# Patient Record
Sex: Male | Born: 1945 | Race: White | Hispanic: No | Marital: Married | Smoking: Former smoker
Health system: Southern US, Community
[De-identification: ages and names within clinical notes are randomized; demographics above are authoritative.]

## PROBLEM LIST (undated history)

## (undated) DIAGNOSIS — T8859XA Other complications of anesthesia, initial encounter: Secondary | ICD-10-CM

## (undated) DIAGNOSIS — G4733 Obstructive sleep apnea (adult) (pediatric): Secondary | ICD-10-CM

## (undated) DIAGNOSIS — R519 Headache, unspecified: Secondary | ICD-10-CM

## (undated) DIAGNOSIS — T753XXA Motion sickness, initial encounter: Secondary | ICD-10-CM

## (undated) DIAGNOSIS — M1990 Unspecified osteoarthritis, unspecified site: Secondary | ICD-10-CM

## (undated) DIAGNOSIS — K5792 Diverticulitis of intestine, part unspecified, without perforation or abscess without bleeding: Secondary | ICD-10-CM

## (undated) DIAGNOSIS — R51 Headache: Secondary | ICD-10-CM

## (undated) DIAGNOSIS — K219 Gastro-esophageal reflux disease without esophagitis: Secondary | ICD-10-CM

## (undated) HISTORY — PX: HAND SURGERY: SHX662

## (undated) HISTORY — PX: KNEE SURGERY: SHX244

---

## 2003-05-05 ENCOUNTER — Ambulatory Visit (HOSPITAL_COMMUNITY): Admission: RE | Admit: 2003-05-05 | Discharge: 2003-05-05 | Payer: Self-pay | Admitting: Gastroenterology

## 2003-05-05 ENCOUNTER — Encounter (INDEPENDENT_AMBULATORY_CARE_PROVIDER_SITE_OTHER): Payer: Self-pay | Admitting: *Deleted

## 2006-12-11 ENCOUNTER — Encounter: Admission: RE | Admit: 2006-12-11 | Discharge: 2006-12-11 | Payer: Self-pay | Admitting: Orthopedic Surgery

## 2010-11-15 NOTE — Op Note (Signed)
NAME:  Manuel Beard, Manuel Beard                           ACCOUNT NO.:  [id number]   MEDICAL RECORD NO.:  [id number]                   PATIENT TYPE:  AMB   LOCATION:  ENDO                                 FACILITY:  [facility]   PHYSICIAN:  Anselmo Rod, M.D.               DATE OF BIRTH:  19-Mar-1946   DATE OF PROCEDURE:  05/05/2003  DATE OF DISCHARGE:                                 OPERATIVE REPORT   PROCEDURE:  Colonoscopy with snare polypectomy x1.   ENDOSCOPIST:  Anselmo Rod, M.D.   INSTRUMENT USED:  Olympus video colonoscope.   INDICATIONS FOR PROCEDURE:  A 65 year old white male undergoing screening  colonoscopy to rule out colonic polyps, masses, etc.   PREPROCEDURE PREPARATION:  Informed consent was obtained from the patient  and the patient was  fasted for 8 hours prior to  the procedure and prepped  with a bottle of magnesium citrate and a gallon of GoLYTELY the  night prior  to the procedure.   PREPROCEDURE PHYSICAL:  The patient had stable vital signs. Neck supple.  Chest clear to auscultation, S1, S2 regular. Abdomen soft with normoactive  bowel sounds.   DESCRIPTION OF PROCEDURE:  The patient was placed in the left lateral  decubitus position and sedated with 80 mg of Demerol and 8 mg of Versed in  slow incremental doses. Once sedation was adequate, the patient was  maintained on low flow oxygen and continuous cardiac monitoring.   The Olympus video colonoscope was advanced from the rectum to the cecum with  difficulty. The patient had a large amount of residual stool in the right  colon. Multiple washings were done. The patient's position  was changed from  the left lateral to the supine position to mobilize the stool pool. The  appendiceal orifice and ileocecal valve were clearly visualized and  photographed. The terminal ileum appeared  normal but small  sessile polyps  were snare from the proximal right colon. No other masses or polyps were  seen. Small lesions  could have been missed.   IMPRESSION:  1. Small nonbleeding internal hemorrhoids.  2. Small sessile polyp snared from the proximal right colon.  3. Sigmoid diverticulosis.  4. Normal appearing transverse colon.  5. A large amount of stool  in the right colon, small lesions could have     been missed.  6. Normal terminal ileum.   RECOMMENDATIONS:  1. Await pathology results.  2. Avoid all nonsteroidals including  aspirin  for the next 3 weeks.  3.     A high fiber diet with liberal fluid intake.  4. Outpatient follow up in the next 1 week or earlier if need be.   RECOMMENDATIONS:  Anselmo Rod, M.D.    JNM/MEDQ  D:  05/05/2003  T:  05/06/2003  Job:  [id number]   cc:   Knox Royalty, M.D.

## 2012-02-12 ENCOUNTER — Emergency Department (HOSPITAL_COMMUNITY): Payer: BC Managed Care – PPO

## 2012-02-12 ENCOUNTER — Encounter (HOSPITAL_COMMUNITY): Payer: Self-pay | Admitting: Emergency Medicine

## 2012-02-12 ENCOUNTER — Emergency Department (HOSPITAL_COMMUNITY)
Admission: EM | Admit: 2012-02-12 | Discharge: 2012-02-12 | Disposition: A | Discharge status: Home / Self Care | Payer: BC Managed Care – PPO | Attending: Emergency Medicine | Admitting: Emergency Medicine

## 2012-02-12 DIAGNOSIS — K5792 Diverticulitis of intestine, part unspecified, without perforation or abscess without bleeding: Secondary | ICD-10-CM

## 2012-02-12 DIAGNOSIS — N509 Disorder of male genital organs, unspecified: Secondary | ICD-10-CM | POA: Insufficient documentation

## 2012-02-12 DIAGNOSIS — R109 Unspecified abdominal pain: Secondary | ICD-10-CM | POA: Insufficient documentation

## 2012-02-12 LAB — CBC WITH DIFFERENTIAL/PLATELET
Basophils Absolute: 0.1 10*3/uL (ref 0.0–0.1)
Basophils Relative: 0 % (ref 0–1)
Eosinophils Relative: 1 % (ref 0–5)
HCT: 43.4 % (ref 39.0–52.0)
Hemoglobin: 15.4 g/dL (ref 13.0–17.0)
Lymphocytes Relative: 10 % — ABNORMAL LOW (ref 12–46)
MCHC: 35.5 g/dL (ref 30.0–36.0)
MCV: 91.2 fL (ref 78.0–100.0)
Monocytes Absolute: 3.2 10*3/uL — ABNORMAL HIGH (ref 0.1–1.0)
Monocytes Relative: 16 % — ABNORMAL HIGH (ref 3–12)
Neutro Abs: 14.6 10*3/uL — ABNORMAL HIGH (ref 1.7–7.7)
RDW: 13.8 % (ref 11.5–15.5)

## 2012-02-12 LAB — COMPREHENSIVE METABOLIC PANEL
ALT: 14 U/L (ref 0–53)
AST: 20 U/L (ref 0–37)
CO2: 23 mEq/L (ref 19–32)
Chloride: 98 mEq/L (ref 96–112)
GFR calc Af Amer: 79 mL/min — ABNORMAL LOW (ref >90)
GFR calc non Af Amer: 68 mL/min — ABNORMAL LOW (ref >90)
Glucose, Bld: 93 mg/dL (ref 70–99)
Sodium: 134 mEq/L — ABNORMAL LOW (ref 135–145)
Total Bilirubin: 1.1 mg/dL (ref 0.3–1.2)

## 2012-02-12 LAB — URINALYSIS, ROUTINE W REFLEX MICROSCOPIC
Glucose, UA: NEGATIVE mg/dL
Ketones, ur: NEGATIVE mg/dL
Leukocytes, UA: NEGATIVE
Protein, ur: NEGATIVE mg/dL
pH: 5 (ref 5.0–8.0)

## 2012-02-12 LAB — LIPASE, BLOOD: Lipase: 47 U/L (ref 11–59)

## 2012-02-12 MED ORDER — CIPROFLOXACIN HCL 500 MG PO TABS: 500.0000 mg | ORAL_TABLET | Freq: Two times a day (BID) | ORAL | Status: AC

## 2012-02-12 MED ORDER — METRONIDAZOLE 500 MG PO TABS: 500.0000 mg | ORAL_TABLET | Freq: Three times a day (TID) | ORAL | Status: AC

## 2012-02-12 MED ORDER — METRONIDAZOLE 500 MG PO TABS
500.0000 mg | ORAL_TABLET | Freq: Once | ORAL | Status: AC
  Administered 2012-02-12: 500 mg via ORAL
  Filled 2012-02-12: qty 1

## 2012-02-12 MED ORDER — SODIUM CHLORIDE 0.9 % IV SOLN: 1000.0000 mL | INTRAVENOUS | Status: DC

## 2012-02-12 MED ORDER — OXYCODONE-ACETAMINOPHEN 5-325 MG PO TABS: 2.0000 | ORAL_TABLET | ORAL | Status: AC | PRN

## 2012-02-12 MED ORDER — IOHEXOL 300 MG/ML  SOLN
100.0000 mL | Freq: Once | INTRAMUSCULAR | Status: AC | PRN
  Administered 2012-02-12: 100 mL via INTRAVENOUS

## 2012-02-12 MED ORDER — CIPROFLOXACIN HCL 500 MG PO TABS
500.0000 mg | ORAL_TABLET | Freq: Once | ORAL | Status: AC
  Administered 2012-02-12: 500 mg via ORAL
  Filled 2012-02-12: qty 1

## 2012-02-12 NOTE — ED Notes (Signed)
Pt presenting to ed with c/o left side flank pain pt denies hematuria. Pt states positive nausea no vomiting pt states positive fever and chills.

## 2012-02-12 NOTE — ED Provider Notes (Signed)
History     CSN: [id number]  Arrival date & time 02/12/12  1506   First MD Initiated Contact with Patient 02/12/12 1549      Chief Complaint  Patient presents with  . Flank Pain    (Consider location/radiation/quality/duration/timing/severity/associated sxs/prior treatment) HPI Complains of bilateral testicular pain onset 3 days ago. Testicular pain has resolved pain movement of left flank earlier today. Left flank pain is nonradiating. Worse with palpation improved with remaining still. Last bowel movement this morning, normal no nausea no vomiting no fever no other complaint no other associated symptoms no treatment prior to coming here. Patient reports feeling has pain when he presses on left flank. Asymptomatic presently History reviewed. No pertinent past medical history. Past medical history diverticulitis "bruised spleen" Past Surgical History  Procedure Date  . Knee surgery     No family history on file.  History  Substance Use Topics  . Smoking status: Former Games developer  . Smokeless tobacco: Not on file  . Alcohol Use: Yes     occassionally      Review of Systems  Constitutional: Negative.   HENT: Negative.   Respiratory: Negative.   Cardiovascular: Negative.   Gastrointestinal: Negative.   Genitourinary: Positive for flank pain and testicular pain.  Musculoskeletal: Negative.   Skin: Negative.   Neurological: Negative.   Hematological: Negative.   Psychiatric/Behavioral: Negative.   All other systems reviewed and are negative.    Allergies  Erythromycin and Penicillins  Home Medications   Current Outpatient Rx  Name Route Sig Dispense Refill  . LORATADINE 10 MG PO TABS Oral Take 10 mg by mouth daily.    Marland Kitchen OMEPRAZOLE 20 MG PO CPDR Oral Take 20 mg by mouth daily.      BP 139/90  Pulse 94  Temp 99.9 F (37.7 C)  Resp 17  SpO2 96%  Physical Exam  Nursing note and vitals reviewed. Constitutional: He appears well-developed and well-nourished.    HENT:  Head: Normocephalic and atraumatic.  Eyes: Conjunctivae are normal. Pupils are equal, round, and reactive to light.  Neck: Neck supple. No tracheal deviation present. No thyromegaly present.  Cardiovascular: Normal rate and regular rhythm.   No murmur heard. Pulmonary/Chest: Effort normal and breath sounds normal.  Abdominal: Soft. Bowel sounds are normal. He exhibits no distension and no mass. There is tenderness. There is guarding.       Tenderness at left upper quadrant and left lower quadrants voluntary guarding  Genitourinary:       Normal male genitalia  Musculoskeletal: Normal range of motion. He exhibits no edema and no tenderness.  Neurological: He is alert. Coordination normal.  Skin: Skin is warm and dry. No rash noted.  Psychiatric: He has a normal mood and affect.    ED Course  Procedures (including critical care time)  Labs Reviewed  URINALYSIS, ROUTINE W REFLEX MICROSCOPIC - Abnormal; Notable for the following:    Hgb urine dipstick SMALL (*)     All other components within normal limits  CBC WITH DIFFERENTIAL - Abnormal; Notable for the following:    WBC 20.0 (*)     Neutro Abs 14.6 (*)     Lymphocytes Relative 10 (*)     Monocytes Relative 16 (*)     Monocytes Absolute 3.2 (*)     All other components within normal limits  URINE MICROSCOPIC-ADD ON  COMPREHENSIVE METABOLIC PANEL   No results found. Results for orders placed during the hospital encounter of 02/12/12  URINALYSIS, ROUTINE  W REFLEX MICROSCOPIC      Component Value Range   Color, Urine YELLOW  YELLOW   APPearance CLEAR  CLEAR   Specific Gravity, Urine 1.026  1.005 - 1.030   pH 5.0  5.0 - 8.0   Glucose, UA NEGATIVE  NEGATIVE mg/dL   Hgb urine dipstick SMALL (*) NEGATIVE   Bilirubin Urine NEGATIVE  NEGATIVE   Ketones, ur NEGATIVE  NEGATIVE mg/dL   Protein, ur NEGATIVE  NEGATIVE mg/dL   Urobilinogen, UA 0.2  0.0 - 1.0 mg/dL   Nitrite NEGATIVE  NEGATIVE   Leukocytes, UA NEGATIVE   NEGATIVE  CBC WITH DIFFERENTIAL      Component Value Range   WBC 20.0 (*) 4.0 - 10.5 K/uL   RBC 4.76  4.22 - 5.81 MIL/uL   Hemoglobin 15.4  13.0 - 17.0 g/dL   HCT 16.1  09.6 - 04.5 %   MCV 91.2  78.0 - 100.0 fL   MCH 32.4  26.0 - 34.0 pg   MCHC 35.5  30.0 - 36.0 g/dL   RDW 40.9  81.1 - 91.4 %   Platelets 176  150 - 400 K/uL   Neutrophils Relative 73  43 - 77 %   Neutro Abs 14.6 (*) 1.7 - 7.7 K/uL   Lymphocytes Relative 10 (*) 12 - 46 %   Lymphs Abs 1.9  0.7 - 4.0 K/uL   Monocytes Relative 16 (*) 3 - 12 %   Monocytes Absolute 3.2 (*) 0.1 - 1.0 K/uL   Eosinophils Relative 1  0 - 5 %   Eosinophils Absolute 0.2  0.0 - 0.7 K/uL   Basophils Relative 0  0 - 1 %   Basophils Absolute 0.1  0.0 - 0.1 K/uL  URINE MICROSCOPIC-ADD ON      Component Value Range   Squamous Epithelial / LPF RARE  RARE   RBC / HPF 0-2  <3 RBC/hpf  LIPASE, BLOOD      Component Value Range   Lipase 47  11 - 59 U/L  COMPREHENSIVE METABOLIC PANEL      Component Value Range   Sodium 134 (*) 135 - 145 mEq/L   Potassium 3.8  3.5 - 5.1 mEq/L   Chloride 98  96 - 112 mEq/L   CO2 23  19 - 32 mEq/L   Glucose, Bld 93  70 - 99 mg/dL   BUN 16  6 - 23 mg/dL   Creatinine, Ser 7.82  0.50 - 1.35 mg/dL   Calcium 9.2  8.4 - 95.6 mg/dL   Total Protein 7.7  6.0 - 8.3 g/dL   Albumin 3.8  3.5 - 5.2 g/dL   AST 20  0 - 37 U/L   ALT 14  0 - 53 U/L   Alkaline Phosphatase 89  39 - 117 U/L   Total Bilirubin 1.1  0.3 - 1.2 mg/dL   GFR calc non Af Amer 68 (*) >90 mL/min   GFR calc Af Amer 79 (*) >90 mL/min   Ct Abdomen Pelvis W Contrast  02/12/2012  *RADIOLOGY REPORT*  Clinical Data: Left flank pain, nausea, fever.  CT ABDOMEN AND PELVIS WITH CONTRAST  Technique:  Multidetector CT imaging of the abdomen and pelvis was performed following the standard protocol during bolus administration of intravenous contrast.  Contrast: OMNIPAQUE IOHEXOL 300 MG/ML  SOLN  Comparison: None.  Findings: Visualized lung bases clear.  Small hiatal  hernia. Probable sub centimeter cyst in the lateral left hepatic segment near the dome.  Otherwise  unremarkable liver, gallbladder, spleen, pancreas, adrenal glands, kidneys.  Patchy aortoiliac atheromatous calcifications without aneurysm.  Stomach and small bowel are nondistended.  Normal appendix.  The colon is nondilated.  There are scattered distal descending and sigmoid diverticula. There is asymmetric wall thickening in the mid and distal portions of the descending colon, with moderate regional edematous/inflammatory changes.  No discrete extraluminal fluid collection to suggest abscess.  No free air.  No ascites.  Urinary bladder is physiologically distended.  Portal vein patent.  No central mesenteric, pelvic, or retroperitoneal adenopathy.  No hydronephrosis. Regional bones unremarkable.  IMPRESSION:  1.  Descending colon diverticulitis without abscess. 2.  Small hiatal hernia.  Original Report Authenticated By: Osa Craver, M.D.     No diagnosis found.  7:40 PM remains comfortable  MDM  Patient not ill-appearing. Can be treated as outpatient plan prescription Cipro, Flagyl, Percocet. Patient has scheduled appointment with Dr. Loreta Ave in 2 weeks Diagnosis diverticulitis        Doug Sou, MD 02/12/12 1950

## 2012-02-12 NOTE — ED Notes (Signed)
Pt denies c/o CP or SOB.

## 2012-09-20 DIAGNOSIS — J342 Deviated nasal septum: Secondary | ICD-10-CM | POA: Insufficient documentation

## 2012-09-20 DIAGNOSIS — J320 Chronic maxillary sinusitis: Secondary | ICD-10-CM | POA: Insufficient documentation

## 2012-09-20 DIAGNOSIS — J309 Allergic rhinitis, unspecified: Secondary | ICD-10-CM | POA: Insufficient documentation

## 2012-09-20 DIAGNOSIS — J343 Hypertrophy of nasal turbinates: Secondary | ICD-10-CM | POA: Insufficient documentation

## 2013-01-10 DIAGNOSIS — R040 Epistaxis: Secondary | ICD-10-CM | POA: Insufficient documentation

## 2014-06-12 ENCOUNTER — Ambulatory Visit: Payer: Self-pay | Admitting: Physician Assistant

## 2014-06-16 LAB — WOUND CULTURE

## 2014-09-07 ENCOUNTER — Ambulatory Visit: Payer: Self-pay | Admitting: Physician Assistant

## 2014-12-26 ENCOUNTER — Encounter: Payer: Self-pay | Admitting: *Deleted

## 2015-01-03 NOTE — Discharge Instructions (Signed)
[facility] °[facility] °ENDOSCOPIC SINUS SURGERY °[facility] ° °What is Functional Endoscopic Sinus Surgery? ° The Surgery involves making the natural openings of the sinuses larger by removing the bony partitions that separate the sinuses from the nasal cavity.  The natural sinus lining is preserved as much as possible to allow the sinuses to resume normal function after the surgery.  In some patients nasal polyps (excessively swollen lining of the sinuses) may be removed to relieve obstruction of the sinus openings.  The surgery is performed through the nose using lighted scopes, which eliminates the need for incisions on the face.  A septoplasty is a different procedure which is sometimes performed with sinus surgery.  It involves straightening the boy partition that separates the two sides of your nose.  A crooked or deviated septum may need repair if is obstructing the sinuses or nasal airflow.  Turbinate reduction is also often performed during sinus surgery.  The turbinates are bony proturberances from the side walls of the nose which swell and can obstruct the nose in patients with sinus and allergy problems.  Their size can be surgically reduced to help relieve nasal obstruction. ° °What Can Sinus Surgery Do For Me? ° Sinus surgery can reduce the frequency of sinus infections requiring antibiotic treatment.  This can provide improvement in nasal congestion, post-nasal drainage, facial pressure and nasal obstruction.  Surgery will NOT prevent you from ever having an infection again, so it usually only for patients who get infections 4 or more times yearly requiring antibiotics, or for infections that do not clear with antibiotics.  It will not cure nasal allergies, so patients with allergies may still require medication to treat their allergies after surgery. Surgery may improve headaches related to sinusitis, however, some people will continue to  require medication to control sinus headaches related to allergies.  Surgery will do nothing for other forms of headache (migraine, tension or cluster). °What Are the Risks of Endoscopic Sinus Surgery? ° Current techniques allow surgery to be performed safely with little risk, however, there are rare complications that patients should be aware of.  Because the sinuses are located around the eyes, there is risk of eye injury, including blindness, though again, this would be quite rare. This is usually a result of bleeding behind the eye during surgery, which puts the vision oat risk, though there are treatments to protect the vision and prevent permanent disrupted by surgery causing a leak of the spinal fluid that surrounds the brain.  More serious complications would include bleeding inside the brain cavity or damage to the brain.  Again, all of these complications are uncommon, and spinal fluid leaks can be safely managed surgically if they occur.  The most common complication of sinus surgery is bleeding from the nose, which may require packing or cauterization of the nose.  Continued sinus have polyps may experience recurrence of the polyps requiring revision surgery.  Alterations of sense of smell or injury to the tear ducts are also rare complications.  °What is the Surgery Like, and what is the Recovery? ° The Surgery usually takes a couple of hours to perform, and is usually performed under a general anesthetic (completely asleep).  Patients are usually discharged home after a couple of hours.  Sometimes during surgery it is necessary to pack the nose to control bleeding, and the packing is left in place for 24 - 48 hours, and removed by your surgeon.  If   a septoplasty was performed during the procedure, there is often a splint placed which must be removed after 5-7 days.   °Discomfort: Pain is usually mild to moderate, and can be controlled by prescription pain medication or acetaminophen (Tylenol).   Aspirin, Ibuprofen (Advil, Motrin), or Naprosyn (Aleve) should be avoided, as they can cause increased bleeding.  Most patients feel sinus pressure like they have a bad head cold for several days.  Sleeping with your head elevated can help reduce swelling and facial pressure, as can ice packs over the face.  A humidifier may be helpful to keep the mucous and blood from drying in the nose.  °Diet: There are no specific diet restrictions, however, you should generally start with clear liquids and a light diet of bland foods because the anesthetic can cause some nausea.  Advance your diet depending on how your stomach feels.  Taking your pain medication with food will often help reduce stomach upset which pain medications can cause. ° °Nasal Saline Irrigation: It is important to remove blood clots and dried mucous from the nose as it is healing.  This is done by having you irrigate the nose at least 3 - 4 times daily with a salt water solution.  The salt water solution is made as follows:  1) 2 - 3 heaping teaspoons of canning, pickling or sea salt °  2) 1 teaspoon baking soda, such as Arm [employer] Hammer °  3) 1 quart of warm distilled water °The nose is irrigated using an ear syringe (available at the drug store).  Fill the bulb with the solution, bend over a sink, and insert the syringe into the nose ½ to ¾ of an inch.  Point the tip of the syringe towards the inside corner of the eye on the same side your irrigating.  Squeeze the syringe and gently irrigate the nose.  If you bend forward as you do this, most of the fluid will flow back out of the nose, instead of down your throat.  Make a new solution every 2 - 3 days.  The solution should be ward, near body temperature, when you irrigate. ° °Note that if you are instructed to use Nasal Steroid Sprays at any time after your surgery, irrigate with saline BEFORE using the steroid spray, so you do not wash it all out of the nose. °Another product, Nasal Saline Gel (such as  AYR Nasal Saline Gel) can be applied in each nostril 3 - 4 times daily to moisture the nose and reduce scabbing or crusting. ° °Bleeding:  Bloody drainage from the nose can be expected for several days, and patients are instructed to irrigate their nose frequently with salt water to help remove mucous and blood clots.  The drainage may be dark red or brown, though some fresh blood may be seen intermittently, especially after irrigation.  Do not blow you nose, as bleeding may occur. If you must sneeze, keep your mouth open to allow air to escape through your mouth. °If heavy bleeding occurs: Irrigate the nose with saline to rinse out clots, then spray the nose 3 - 4 times with Afrin Nasal Decongestant Spray.  The spray will constrict the blood vessels to slow bleeding.  Pinch the lower half of your nose shut to apply pressure, and lay down with your head elevated.  Ice packs over the nose may help as well. If bleeding persists despite these measures, you should notify your doctor.  Do not use the Afrin routinely   to control nasal congestion after surgery, as it can result in worsening congestion and may affect healing.  ° °Activity: Return to work varies among patients. Most patients will be out of work at least 5 - 7 days to recover.  Patient may return to work after they are off of narcotic pain medication, and feeling well enough to perform the functions of their job.  Patients must avoid heavy lifting (over 10 pounds) or strenuous physical for 2 weeks after surgery, so your employer may need to assign you to light duty, or keep you out of work longer if light duty is not possible.  NOTE: you should not drive, operate dangerous machinery, do any mentally demanding tasks or make any important legal or financial decisions while on narcotic pain medication and recovering from the general anesthetic.  °  °Call Your Doctor Immediately if You Have Any of the Following: °1. Bleeding that you cannot control with the above  measures °2. Loss of vision, double vision, bulging of the eye or black eyes. °3. Fever over 101 degrees °4. Neck stiffness with severe headache, fever, nausea and change in mental state. °You are always encourage to call anytime with concerns, however, please call with requests for pain medication refills during office hours. ° °Office Endoscopy: During follow-up visits your doctor will remove any packing or splints that may have been placed and evaluate and clean your sinuses endoscopically.  Topical anesthetic will be used to make this as comfortable as possible, though you may want to take your pain medication prior to the visit.  How often this will need to be done varies from patient to patient.  After complete recovery from the surgery, you may need follow-up endoscopy from time to time, particularly if there is concern of recurrent infection or nasal polyps. ° ° °General Anesthesia, Care After °Refer to this sheet in the next few weeks. These instructions provide you with information on caring for yourself after your procedure. Your health care provider may also give you more specific instructions. Your treatment has been planned according to current medical practices, but problems sometimes occur. Call your health care provider if you have any problems or questions after your procedure. °WHAT TO EXPECT AFTER THE PROCEDURE °After the procedure, it is typical to experience: °· Sleepiness. °· Nausea and vomiting. °HOME CARE INSTRUCTIONS °· For the first 24 hours after general anesthesia: °¨ Have a responsible person with you. °¨ Do not drive a car. If you are alone, do not take public transportation. °¨ Do not drink alcohol. °¨ Do not take medicine that has not been prescribed by your health care provider. °¨ Do not sign important papers or make important decisions. °¨ You may resume a normal diet and activities as directed by your health care provider. °· Change bandages (dressings) as directed. °· If you  have questions or problems that seem related to general anesthesia, call the hospital and ask for the anesthetist or anesthesiologist on call. °SEEK MEDICAL CARE IF: °· You have nausea and vomiting that continue the day after anesthesia. °· You develop a rash. °SEEK IMMEDIATE MEDICAL CARE IF:  °· You have difficulty breathing. °· You have chest pain. °· You have any allergic problems. °Document Released: 09/22/2000 Document Revised: 06/21/2013 Document Reviewed: 12/30/2012 °[employer]® Patient Information ©2015 [employer]. This information is not intended to replace advice given to you by your health care provider. Make sure you discuss any questions you have with your health care provider. ° °

## 2015-01-04 ENCOUNTER — Ambulatory Visit
Admission: RE | Admit: 2015-01-04 | Discharge: 2015-01-04 | Disposition: A | Discharge status: Home / Self Care | Payer: BLUE CROSS/BLUE SHIELD | Source: Ambulatory Visit | Attending: Otolaryngology | Admitting: Otolaryngology

## 2015-01-04 ENCOUNTER — Ambulatory Visit: Payer: BLUE CROSS/BLUE SHIELD | Admitting: Anesthesiology

## 2015-01-04 ENCOUNTER — Encounter: Payer: Self-pay | Admitting: *Deleted

## 2015-01-04 ENCOUNTER — Encounter
Admission: RE | Disposition: A | Discharge status: Home / Self Care | Payer: Self-pay | Source: Ambulatory Visit | Attending: Otolaryngology

## 2015-01-04 DIAGNOSIS — D721 Eosinophilia: Secondary | ICD-10-CM | POA: Insufficient documentation

## 2015-01-04 DIAGNOSIS — Z8349 Family history of other endocrine, nutritional and metabolic diseases: Secondary | ICD-10-CM | POA: Insufficient documentation

## 2015-01-04 DIAGNOSIS — J343 Hypertrophy of nasal turbinates: Secondary | ICD-10-CM | POA: Diagnosis not present

## 2015-01-04 DIAGNOSIS — J342 Deviated nasal septum: Secondary | ICD-10-CM | POA: Insufficient documentation

## 2015-01-04 DIAGNOSIS — Z880 Allergy status to penicillin: Secondary | ICD-10-CM | POA: Diagnosis not present

## 2015-01-04 DIAGNOSIS — G4733 Obstructive sleep apnea (adult) (pediatric): Secondary | ICD-10-CM | POA: Insufficient documentation

## 2015-01-04 DIAGNOSIS — Z809 Family history of malignant neoplasm, unspecified: Secondary | ICD-10-CM | POA: Diagnosis not present

## 2015-01-04 DIAGNOSIS — K5790 Diverticulosis of intestine, part unspecified, without perforation or abscess without bleeding: Secondary | ICD-10-CM | POA: Diagnosis not present

## 2015-01-04 DIAGNOSIS — Z79899 Other long term (current) drug therapy: Secondary | ICD-10-CM | POA: Diagnosis not present

## 2015-01-04 DIAGNOSIS — J324 Chronic pansinusitis: Secondary | ICD-10-CM | POA: Diagnosis present

## 2015-01-04 HISTORY — DX: Motion sickness, initial encounter: T75.3XXA

## 2015-01-04 HISTORY — PX: IMAGE GUIDED SINUS SURGERY: SHX6570

## 2015-01-04 HISTORY — DX: Headache: R51

## 2015-01-04 HISTORY — DX: Unspecified osteoarthritis, unspecified site: M19.90

## 2015-01-04 HISTORY — PX: FRONTAL SINUS EXPLORATION: SHX6591

## 2015-01-04 HISTORY — PX: SEPTOPLASTY WITH ETHMOIDECTOMY, AND MAXILLARY ANTROSTOMY: SHX6090

## 2015-01-04 HISTORY — DX: Headache, unspecified: R51.9

## 2015-01-04 HISTORY — PX: TURBINATE REDUCTION: SHX6157

## 2015-01-04 HISTORY — DX: Gastro-esophageal reflux disease without esophagitis: K21.9

## 2015-01-04 HISTORY — PX: SPHENOIDECTOMY: SHX2421

## 2015-01-04 SURGERY — SINUS SURGERY, WITH IMAGING GUIDANCE
Anesthesia: General | Wound class: Clean Contaminated

## 2015-01-04 MED ORDER — LACTATED RINGERS IV SOLN: 500.0000 mL | INTRAVENOUS | Status: DC

## 2015-01-04 MED ORDER — ROCURONIUM BROMIDE 100 MG/10ML IV SOLN
INTRAVENOUS | Status: DC | PRN
  Administered 2015-01-04: 30 mg via INTRAVENOUS
  Administered 2015-01-04: 20 mg via INTRAVENOUS

## 2015-01-04 MED ORDER — PHENYLEPHRINE HCL 0.5 % NA SOLN
NASAL | Status: DC | PRN
  Administered 2015-01-04: 30 mL

## 2015-01-04 MED ORDER — ONDANSETRON HCL 4 MG/2ML IJ SOLN
INTRAMUSCULAR | Status: DC | PRN
  Administered 2015-01-04: 4 mg via INTRAVENOUS

## 2015-01-04 MED ORDER — PROPOFOL 10 MG/ML IV BOLUS
INTRAVENOUS | Status: DC | PRN
  Administered 2015-01-04: 200 mg via INTRAVENOUS

## 2015-01-04 MED ORDER — LACTATED RINGERS IV SOLN
INTRAVENOUS | Status: DC
  Administered 2015-01-04 (×2): via INTRAVENOUS

## 2015-01-04 MED ORDER — SUCCINYLCHOLINE CHLORIDE 20 MG/ML IJ SOLN
INTRAMUSCULAR | Status: DC | PRN
  Administered 2015-01-04: 100 mg via INTRAVENOUS

## 2015-01-04 MED ORDER — OXYCODONE HCL 5 MG/5ML PO SOLN
5.0000 mg | Freq: Once | ORAL | Status: DC | PRN
Start: 2015-01-04 — End: 2015-01-04

## 2015-01-04 MED ORDER — FENTANYL CITRATE (PF) 100 MCG/2ML IJ SOLN
INTRAMUSCULAR | Status: DC | PRN
  Administered 2015-01-04: 100 ug via INTRAVENOUS
  Administered 2015-01-04 (×2): 50 ug via INTRAVENOUS

## 2015-01-04 MED ORDER — CEFAZOLIN SODIUM-DEXTROSE 2-3 GM-% IV SOLR
2.0000 g | Freq: Once | INTRAVENOUS | Status: AC
  Administered 2015-01-04: 2 g via INTRAVENOUS

## 2015-01-04 MED ORDER — MEPERIDINE HCL 25 MG/ML IJ SOLN: 6.2500 mg | INTRAMUSCULAR | Status: DC | PRN

## 2015-01-04 MED ORDER — ONDANSETRON HCL 4 MG/2ML IJ SOLN
4.0000 mg | Freq: Once | INTRAMUSCULAR | Status: AC | PRN
  Administered 2015-01-04: 4 mg via INTRAVENOUS

## 2015-01-04 MED ORDER — OXYCODONE HCL 5 MG PO TABS: 5.0000 mg | ORAL_TABLET | Freq: Once | ORAL | Status: DC | PRN

## 2015-01-04 MED ORDER — MIDAZOLAM HCL 5 MG/5ML IJ SOLN
INTRAMUSCULAR | Status: DC | PRN
  Administered 2015-01-04: 2 mg via INTRAVENOUS

## 2015-01-04 MED ORDER — LIDOCAINE HCL (CARDIAC) 20 MG/ML IV SOLN
INTRAVENOUS | Status: DC | PRN
  Administered 2015-01-04: 40 mg via INTRAVENOUS

## 2015-01-04 MED ORDER — LIDOCAINE-EPINEPHRINE 1 %-1:100000 IJ SOLN
INTRAMUSCULAR | Status: DC | PRN
  Administered 2015-01-04: 11.5 mL

## 2015-01-04 MED ORDER — OXYMETAZOLINE HCL 0.05 % NA SOLN
2.0000 | Freq: Once | NASAL | Status: AC
  Administered 2015-01-04: 2 via NASAL

## 2015-01-04 MED ORDER — DEXAMETHASONE SODIUM PHOSPHATE 4 MG/ML IJ SOLN
INTRAMUSCULAR | Status: DC | PRN
  Administered 2015-01-04: 10 mg via INTRAVENOUS

## 2015-01-04 MED ORDER — ACETAMINOPHEN 10 MG/ML IV SOLN
INTRAVENOUS | Status: DC | PRN
  Administered 2015-01-04: 1000 mg via INTRAVENOUS

## 2015-01-04 MED ORDER — HYDROMORPHONE HCL 1 MG/ML IJ SOLN: 0.2500 mg | INTRAMUSCULAR | Status: DC | PRN

## 2015-01-04 SURGICAL SUPPLY — 49 items
BALLOON SINUPLASTY SYSTEM (BALLOONS) ×3 IMPLANT
BATTERY INSTRU NAVIGATION (MISCELLANEOUS) ×12 IMPLANT
BLADE SURG 15 STRL LF DISP TIS (BLADE) IMPLANT
BLADE SURG 15 STRL SS (BLADE)
CANISTER SUCT 1200ML W/VALVE (MISCELLANEOUS) ×3 IMPLANT
CATH IV 18X1 1/4 SAFELET (CATHETERS) ×3 IMPLANT
COAG SUCT 10F 3.5MM HAND CTRL (MISCELLANEOUS) ×3 IMPLANT
COAGULATOR SUCT 8FR VV (MISCELLANEOUS) IMPLANT
DEVICE INFLATION SEID (MISCELLANEOUS) IMPLANT
DRAPE HEAD BAR (DRAPES) ×3 IMPLANT
DRESSING NASL FOAM PST OP SINU (MISCELLANEOUS) IMPLANT
DRSG NASAL 4CM NASOPORE (MISCELLANEOUS) IMPLANT
DRSG NASAL FOAM POST OP SINU (MISCELLANEOUS)
GLOVE BIO SURGEON STRL SZ7 (GLOVE) ×6 IMPLANT
GLOVE PI ULTRA LF STRL 7.5 (GLOVE) ×4 IMPLANT
GLOVE PI ULTRA NON LATEX 7.5 (GLOVE) ×2
GOWN STRL REUS W/ TWL LRG LVL3 (GOWN DISPOSABLE) ×4 IMPLANT
GOWN STRL REUS W/TWL LRG LVL3 (GOWN DISPOSABLE) ×2
IMPL PROPEL MINI SINUS (Prosthesis and Implant ENT) ×4 IMPLANT
IMPLANT PROPEL MINI SINUS (Prosthesis and Implant ENT) ×6 IMPLANT
IRRIGATOR 4MM STR (IRRIGATION / IRRIGATOR) ×3 IMPLANT
IV CATH 18X1 1/4 SAFELET (CATHETERS) ×2
IV NS 500ML (IV SOLUTION) ×1
IV NS 500ML BAXH (IV SOLUTION) ×2 IMPLANT
NAVIGATION MASK REG  ST (MISCELLANEOUS) ×3 IMPLANT
NEEDLE HYPO 25GX1X1/2 BEV (NEEDLE) ×3 IMPLANT
NEEDLE SPNL 25GX3.5 QUINCKE BL (NEEDLE) ×3 IMPLANT
NS IRRIG 500ML POUR BTL (IV SOLUTION) ×3 IMPLANT
PACK DRAPE NASAL/ENT (PACKS) ×3 IMPLANT
PACKING NASAL EPIS 4X2.4 XEROG (MISCELLANEOUS) ×3 IMPLANT
PAD GROUND ADULT SPLIT (MISCELLANEOUS) ×3 IMPLANT
PATTIES SURGICAL .5 X3 (DISPOSABLE) ×3 IMPLANT
SET HANDPIECE IRR DIEGO (MISCELLANEOUS) ×3 IMPLANT
SINUPLASTY BALLN CATHTIP (CATHETERS) ×3 IMPLANT
SOL ANTI-FOG 6CC FOG-OUT (MISCELLANEOUS) ×2 IMPLANT
SOL FOG-OUT ANTI-FOG 6CC (MISCELLANEOUS) ×1
SPLINT NASAL SEPTAL BLV .50 ST (MISCELLANEOUS) ×3 IMPLANT
STRAP BODY AND KNEE 60X3 (MISCELLANEOUS) ×3 IMPLANT
SUT CHROMIC 3-0 (SUTURE) ×1
SUT CHROMIC 3-0 KS 27XMFL CR (SUTURE) ×2
SUT ETHILON 3-0 KS 30 BLK (SUTURE) ×3 IMPLANT
SUT ETHILON 4-0 (SUTURE)
SUT ETHILON 4-0 FS2 18XMFL BLK (SUTURE)
SUT PLAIN GUT 4-0 (SUTURE) ×3 IMPLANT
SUTURE CHRMC 3-0 KS 27XMFL CR (SUTURE) ×2 IMPLANT
SUTURE ETHLN 4-0 FS2 18XMF BLK (SUTURE) IMPLANT
SYR 3ML LL SCALE MARK (SYRINGE) ×3 IMPLANT
TOWEL OR 17X26 4PK STRL BLUE (TOWEL DISPOSABLE) ×3 IMPLANT
WATER STERILE IRR 500ML POUR (IV SOLUTION) ×3 IMPLANT

## 2015-01-04 NOTE — Op Note (Signed)
01/04/2015  2:56 PM    Manuel Beard  [id number]   Pre-Op Dx:  Septal deviation, turbinate hypertrophy, chronic bilateral pansinusitis  Post-op Dx: Septal deviation, turbinate hypertrophy, chronic bilateral pansinusitis  Proc: Septoplasty, bilateral endoscopic total ethmoidectomy, bilateral endoscopic maxillary antrostomy, bilateral endoscopic frontal sinusotomy, bilateral endoscopic sphenoid sinusotomy, bilateral partial reduction of the inferior turbinates, IGSS  Surg:  Manuel Beard  Anes:  GOT  EBL:  350 mL  Comp:  None  Findings:  Polypoid changes at the openings to the sphenoid and anterior ethmoid sinuses bilaterally. Chronic inflammation involving all the sinuses. Markedly deviated septum.  Procedure: The patient was given general anesthesia by oral endotracheal intubation. He was placed in a supine position. His nose was prepped using 6 mL of 1% Xylocaine with epi 100,000 for infiltration nasal septum. Cottonoid pledgets soaked in phenylephrine and Xylocaine were then placed both sides the nose and left there while the patient was prepped and draped sterile fashion.  Image guided system was brought in and the CT scan was downloaded to the system area and the template was applied to the face and the template was registered then to the system. There is 0.9 mm of variance. The dissection instruments were registered the system and checked and they were off slightly. The system was reregistered. There was much better alignment this time.  Complex removed and the 0 scope was used to visualize the nose anterior septum was markedly deviated and pushing way out into the right nasal opening. The caudal tip was buckled. There was a spur posteriorly on the right side of the vomer touching the lateral nasal wall. A left hemitransfixion incision was created with elevation of mucoperichondrium on both sides the quadrangular plate. It was buckled on itself anteriorly and was difficult to get  this cleaned off. I trimmed the most caudal and that was buckled on itself and then used a morselizer to break the spring of the cartilage. The bony cartilaginous junction was then split and the mucoperiosteum was elevated on both sides of the vomer and ethmoid plate. The very large vomer spur was removed and some of the crooked ethmoid plate was removed as well. His allow the mucosal flaps downfall back in their anatomic position and the septum could sit straight in the midline. 3-0 chromic sutures were used through and through to anchor it at the anterior nasal spine and to hold the anterior flaps together and close the hemitransfixion incision. Oral plain gut suture was used to through and through whip stitch fashion for closure of the rest of the septum.  0 scope was used in the left side visualize the nasal passageways the middle turbinate had been lateralized from the septum and was now medialized provide better access to the middle meatus. There is polypoid changes here. The uncinate process was incised and was removed using the [facility] microdebrider and through biting forceps. The 30 scope was used to visualize the maxillary antrum and this was widened. He Coso was inflamed in the maxillary sinus with some clear thick mucus that was suctioned out.  there was inflamed tissue superiorly there was removed as well with 90 up-biting forceps. This completed cleaning of the maxillary sinus.  The posterior middle ethmoid air cells were opened using the microdebrider. The image guided system was used for evaluating the depth of dissection and making sure all the sinuses were opened. The middle turbinate was medialized to the opening to the sphenoid sinus. There was polypoid changes here. The this sinus  opening was widened using the sphenoid punch and make sure it oh opened into the posterior ethmoid air cell to provide good drainage. The middle turbinate was medialized again and the 30 scope was then used to  open the anterior ethmoid air cells. Once this was opened the frontal sinus duct was noted to be medial. An eclamptic balloon Sinuplasty was used to thread into the frontal sinus and dilate the opening to 12 cm pressure. The opening was widened using frontal sinus through biting instruments make sure there was a good clear drainage port from the frontal sinus. Cottonoid pledges were placed here temporarily while the opposite side was addressed.   The 0 scope was used to visualize the right to the nasal passage and the middle turbinate was infractured.  the uncinate process was incised. The uncinate process was removed with the [facility] microdebrider and through biting forceps the maxillary antrum was widened with the debrider as well and 30 scope was used to make sure this was completely opened and clear. Again thickened mucous membranes and thick mucus were suctioned out. The 0 scope was used with the [place] for opening the middle and posterior ethmoid air cells. The image guided system was used to make sure all the sinuses were clear. The sphenoid sinus was opened again using sphenoid punch. There were polypoid changes around the opening that were removed. The party wall between sphenoid sinus posterior ethmoid was removed to provide better drainage.  The 30 scope was used to visualize the anterior ethmoid air cells and these were cleared out using the image guided system to make sure they were all cleared. I had a difficult time finding the opening to the right frontal sinus. I used the 30 and 70 scopes and the frontal sinus image guided suction to finally find the opening and track it into the frontal sinus. A frontal sinus instruments were used to widen this with the through biting forceps. Once this was completely open cottonoid pledgets were placed or temporarily in the left side was visualized again.  The left side was open and clear and I can see into the frontal sinus small piece of  Xerogel was placed right at the frontal sinus opening followed by a mini Propel stent. Another piece of Xerogel was then placed inside the stent. This was repeated on the right side. There was a good open airway on both sides. Xomed 0.5 mm regular sized splints were then placed on both sides of the septum and sutured through and through with a 3-0 nylon.  The patient was awakened taken to the recovery room in satisfactory condition. There were no operative complications.  Dispo:   To PACU and then to home  Plan:  To rest at home with head elevated. Follow-up in 6 days. Start saline flushes tomorrow  Huey Romans  01/04/2015 2:56 PM

## 2015-01-04 NOTE — Anesthesia Preprocedure Evaluation (Signed)
Anesthesia Evaluation  Patient identified by MRN, date of birth, ID band Patient awake    Reviewed: Allergy & Precautions, H&P , NPO status , Patient's Chart, lab work & pertinent test results, reviewed documented beta blocker date and time   Airway Mallampati: II  TM Distance: >3 FB Neck ROM: full    Dental no notable dental hx.    Pulmonary former smoker,  breath sounds clear to auscultation  Pulmonary exam normal       Cardiovascular Exercise Tolerance: Good negative cardio ROS  Rhythm:regular Rate:Normal     Neuro/Psych  Headaches, negative psych ROS   GI/Hepatic Neg liver ROS, GERD-  Medicated,  Endo/Other  negative endocrine ROS  Renal/GU negative Renal ROS  negative genitourinary   Musculoskeletal   Abdominal   Peds  Hematology negative hematology ROS (+)   Anesthesia Other Findings   Reproductive/Obstetrics negative OB ROS                             Anesthesia Physical Anesthesia Plan  ASA: II  Anesthesia Plan: General   Post-op Pain Management:    Induction:   Airway Management Planned:   Additional Equipment:   Intra-op Plan:   Post-operative Plan:   Informed Consent: I have reviewed the patients History and Physical, chart, labs and discussed the procedure including the risks, benefits and alternatives for the proposed anesthesia with the patient or authorized representative who has indicated his/her understanding and acceptance.     Plan Discussed with: CRNA  Anesthesia Plan Comments:         Anesthesia Quick Evaluation

## 2015-01-04 NOTE — Transfer of Care (Signed)
Immediate Anesthesia Transfer of Care Note  Patient: Arnulfo Batson  Procedure(s) Performed: Procedure(s) with comments: IMAGE GUIDED SINUS SURGERY (N/A) - GAVE DISK TO CE CE SEPTOPLASTY WITH ETHMOIDECTOMY, AND MAXILLARY ANTROSTOMY (Bilateral) FRONTAL SINUS EXPLORATION (Bilateral) SPHENOIDECTOMY (Bilateral) INFERIOR TURBINATE REDUCTION  (Bilateral)  Patient Location: PACU  Anesthesia Type: General  Level of Consciousness: awake, alert  and patient cooperative  Airway and Oxygen Therapy: Patient Spontanous Breathing and Patient connected to supplemental oxygen  Post-op Assessment: Post-op Vital signs reviewed, Patient's Cardiovascular Status Stable, Respiratory Function Stable, Patent Airway and No signs of Nausea or vomiting  Post-op Vital Signs: Reviewed and stable  Complications: No apparent anesthesia complications

## 2015-01-04 NOTE — Anesthesia Procedure Notes (Signed)
Procedure Name: Intubation Date/Time: 01/04/2015 11:18 AM Performed by: Londell Moh Pre-anesthesia Checklist: Patient identified, Emergency Drugs available, Suction available, Patient being monitored and Timeout performed Patient Re-evaluated:Patient Re-evaluated prior to inductionOxygen Delivery Method: Circle system utilized Preoxygenation: Pre-oxygenation with 100% oxygen Intubation Type: IV induction Ventilation: Mask ventilation without difficulty Laryngoscope Size: Mac and 3 Grade View: Grade I Tube type: Oral Rae Tube size: 7.5 mm Number of attempts: 1 Placement Confirmation: ETT inserted through vocal cords under direct vision,  positive ETCO2 and breath sounds checked- equal and bilateral Tube secured with: Tape Dental Injury: Teeth and Oropharynx as per pre-operative assessment

## 2015-01-04 NOTE — H&P (Signed)
  H&P has been reviewed and no changes necessary. To be downloaded later. 

## 2015-01-04 NOTE — Anesthesia Postprocedure Evaluation (Signed)
  Anesthesia Post-op Note  Patient: Krikor Willet  Procedure(s) Performed: Procedure(s) with comments: IMAGE GUIDED SINUS SURGERY (N/A) - GAVE DISK TO CE CE SEPTOPLASTY WITH ETHMOIDECTOMY, AND MAXILLARY ANTROSTOMY (Bilateral) FRONTAL SINUS EXPLORATION (Bilateral) SPHENOIDECTOMY (Bilateral) INFERIOR TURBINATE REDUCTION  (Bilateral)  Anesthesia type:General  Patient location: PACU  Post pain: Pain level controlled  Post assessment: Post-op Vital signs reviewed, Patient's Cardiovascular Status Stable, Respiratory Function Stable, Patent Airway and No signs of Nausea or vomiting  Post vital signs: Reviewed and stable  Last Vitals:  Filed Vitals:   01/04/15 1038  BP: 135/88  Pulse: 71  Temp: 36.5 C  Resp: 18    Level of consciousness: awake, alert  and patient cooperative  Complications: No apparent anesthesia complications

## 2015-01-05 ENCOUNTER — Encounter: Payer: Self-pay | Admitting: Otolaryngology

## 2015-01-08 LAB — SURGICAL PATHOLOGY

## 2015-01-25 ENCOUNTER — Ambulatory Visit
Admission: EM | Admit: 2015-01-25 | Discharge: 2015-01-25 | Disposition: A | Discharge status: Home / Self Care | Payer: BLUE CROSS/BLUE SHIELD

## 2015-01-25 NOTE — ED Notes (Signed)
Pt left for his doctor appointment and said he would return in 20 minutes it has now been over a hour. Pt has not return and pt will be discharged from [facility].

## 2015-03-22 ENCOUNTER — Ambulatory Visit: Payer: BLUE CROSS/BLUE SHIELD | Attending: Otolaryngology

## 2015-03-22 DIAGNOSIS — G4733 Obstructive sleep apnea (adult) (pediatric): Secondary | ICD-10-CM | POA: Insufficient documentation

## 2015-03-22 DIAGNOSIS — G2581 Restless legs syndrome: Secondary | ICD-10-CM | POA: Insufficient documentation

## 2015-03-22 DIAGNOSIS — R0683 Snoring: Secondary | ICD-10-CM | POA: Diagnosis not present

## 2015-09-10 DIAGNOSIS — M79645 Pain in left finger(s): Secondary | ICD-10-CM | POA: Diagnosis not present

## 2015-09-10 DIAGNOSIS — M79644 Pain in right finger(s): Secondary | ICD-10-CM | POA: Diagnosis not present

## 2015-10-02 DIAGNOSIS — Z880 Allergy status to penicillin: Secondary | ICD-10-CM | POA: Diagnosis not present

## 2015-10-02 DIAGNOSIS — E669 Obesity, unspecified: Secondary | ICD-10-CM | POA: Diagnosis not present

## 2015-10-02 DIAGNOSIS — G8918 Other acute postprocedural pain: Secondary | ICD-10-CM | POA: Diagnosis not present

## 2015-10-02 DIAGNOSIS — M19042 Primary osteoarthritis, left hand: Secondary | ICD-10-CM | POA: Diagnosis not present

## 2015-10-02 DIAGNOSIS — Z882 Allergy status to sulfonamides status: Secondary | ICD-10-CM | POA: Diagnosis not present

## 2015-10-02 DIAGNOSIS — Z87891 Personal history of nicotine dependence: Secondary | ICD-10-CM | POA: Diagnosis not present

## 2015-10-02 DIAGNOSIS — M1812 Unilateral primary osteoarthritis of first carpometacarpal joint, left hand: Secondary | ICD-10-CM | POA: Diagnosis not present

## 2015-10-02 DIAGNOSIS — G4730 Sleep apnea, unspecified: Secondary | ICD-10-CM | POA: Diagnosis not present

## 2015-10-02 HISTORY — PX: WRIST ARTHROSCOPY WITH CARPOMETACARPEL (CMC) ARTHROPLASTY: SHX5680

## 2015-10-19 ENCOUNTER — Encounter: Payer: Self-pay | Admitting: Gynecology

## 2015-10-19 ENCOUNTER — Ambulatory Visit
Admission: EM | Admit: 2015-10-19 | Discharge: 2015-10-19 | Disposition: A | Discharge status: Home / Self Care | Payer: Medicare Other | Attending: Family Medicine | Admitting: Family Medicine

## 2015-10-19 DIAGNOSIS — L270 Generalized skin eruption due to drugs and medicaments taken internally: Secondary | ICD-10-CM

## 2015-10-19 MED ORDER — TRIAMCINOLONE ACETONIDE 0.1 % EX CREA: 1.0000 "application " | TOPICAL_CREAM | Freq: Two times a day (BID) | CUTANEOUS | Status: DC

## 2015-10-19 MED ORDER — METHYLPREDNISOLONE 4 MG PO TBPK: ORAL_TABLET | ORAL | Status: DC

## 2015-10-19 NOTE — ED Provider Notes (Signed)
CSN: [id number]     Arrival date & time 10/19/15  1449 History   First MD Initiated Contact with Patient 10/19/15 1526     Chief Complaint  Patient presents with  . Pruritis   (Consider location/radiation/quality/duration/timing/severity/associated sxs/prior Treatment) HPI  This a 70 year old gentleman who presents with  itching all over for 4 days. The patient had hand surgery on his left hand on 10/02/2015. He stated that his cast was removed about 4 days ago. He took his last dose of oxycodone on Monday also 4 days ago and Tuesday began to have the itching sensation. Itching is apparently random occurring at different parts of his body at different times. His feet were itching terribly while  in bed. Today he has occasional itching in various places .Has noticed a small rash in patches; no large patches or urticarial type lesions. He mowed grass prior to this visit today but  itching he had predated this. He denies any change in personal products or detergents. Only significant change is the medication of  oxycodone.   Past Medical History  Diagnosis Date  . GERD (gastroesophageal reflux disease)     rare  . Arthritis     thumbs  . Headache     sinus  . Motion sickness     amusement park rides   Past Surgical History  Procedure Laterality Date  . Knee surgery Right     x1  . Knee surgery Left     x2  . Image guided sinus surgery N/A 01/04/2015    Procedure: IMAGE GUIDED SINUS SURGERY;  Surgeon: Margaretha Sheffield, MD;  Location: [place];  Service: ENT;  Laterality: N/A;  GAVE DISK TO CE CE  . Septoplasty with ethmoidectomy, and maxillary antrostomy Bilateral 01/04/2015    Procedure: SEPTOPLASTY WITH ETHMOIDECTOMY, AND MAXILLARY ANTROSTOMY;  Surgeon: Margaretha Sheffield, MD;  Location: [place];  Service: ENT;  Laterality: Bilateral;  . Frontal sinus exploration Bilateral 01/04/2015    Procedure: FRONTAL SINUS EXPLORATION;  Surgeon: Margaretha Sheffield, MD;  Location: [place];  Service: ENT;  Laterality: Bilateral;  . Sphenoidectomy Bilateral 01/04/2015    Procedure: Coralee Pesa;  Surgeon: Margaretha Sheffield, MD;  Location: [place];  Service: ENT;  Laterality: Bilateral;  . Turbinate reduction Bilateral 01/04/2015    Procedure: INFERIOR TURBINATE REDUCTION ;  Surgeon: Margaretha Sheffield, MD;  Location: [place];  Service: ENT;  Laterality: Bilateral;  . Wrist arthroscopy with carpometacarpel (cmc) arthroplasty  10/02/2015    reposition interposition arthroplasty.   No family history on file. Social History  Substance Use Topics  . Smoking status: Former Research scientist (life sciences)  . Smokeless tobacco: None  . Alcohol Use: Yes     Comment: occassionally    Review of Systems  Constitutional: Positive for activity change. Negative for fever, chills and fatigue.  Skin: Positive for rash.  All other systems reviewed and are negative.   Allergies  Erythromycin; Iron supplement; and Penicillins  Home Medications   Prior to Admission medications   Medication Sig Start Date End Date Taking? Authorizing Provider  omeprazole (PRILOSEC) 20 MG capsule Take 20 mg by mouth daily as needed.    Yes Historical Provider, MD  oxyCODONE-acetaminophen (PERCOCET/ROXICET) 5-325 MG tablet Take by mouth every 4 (four) hours as needed for severe pain.   Yes Historical Provider, MD  triamcinolone (NASACORT AQ) 55 MCG/ACT AERO nasal inhaler Place 2 sprays into the nose daily. AM   Yes Historical Provider, MD  Ca Carbonate-Mag Hydroxide (ROLAIDS  PO) Take by mouth as needed.    Historical Provider, MD  cephALEXin (KEFLEX) 500 MG capsule Take 500 mg by mouth 3 (three) times daily. 01/04/15   Margaretha Sheffield, MD  methylPREDNISolone (MEDROL DOSEPAK) 4 MG TBPK tablet Take per package instructions 10/19/15   Lorin Picket, PA-C  triamcinolone cream (KENALOG) 0.1 % Apply 1 application topically 2 (two) times daily. 10/19/15   Lorin Picket, PA-C   Meds Ordered and Administered this Visit   Medications - No data to display  BP 125/85 mmHg  Pulse 75  Temp(Src) 98 F (36.7 C) (Oral)  Resp 16  Ht 5\' 11"  (1.803 m)  Wt 231 lb (104.781 kg)  BMI 32.23 kg/m2  SpO2 99% No data found.   Physical Exam  Constitutional: He is oriented to person, place, and time. He appears well-developed and well-nourished. No distress.  HENT:  Head: Normocephalic and atraumatic.  Eyes: Conjunctivae are normal. Pupils are equal, round, and reactive to light.  Neck: Normal range of motion. Neck supple.  Musculoskeletal: Normal range of motion.  Neurological: He is alert and oriented to person, place, and time.  Skin: Skin is warm and dry. Rash noted. He is not diaphoretic.  Examination of his skin shows several small patches of macular papular rash with an erythematous base. Excoriations are present. The rash is isolated with several areas involving the legs and arms mostly sparing the face and back. He has one small patch on his abdomen. There are no web space lesions.  Psychiatric: He has a normal mood and affect. His behavior is normal. Judgment and thought content normal.  Nursing note and vitals reviewed.   ED Course  Procedures (including critical care time)  Labs Review Labs Reviewed - No data to display  Imaging Review No results found.   Visual Acuity Review  Right Eye Distance:   Left Eye Distance:   Bilateral Distance:    Right Eye Near:   Left Eye Near:    Bilateral Near:         MDM   1. 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 Test/x-ray results and diagnosis reviewed with patient 2. rx as per orders; risks, benefits, potential side  effects reviewed with patient 3. Recommend supportive treatment with not Taking any further oxycodone. I recommended using Zyrtec or Allegra during the daytime and on Benadryl at nighttime. Given a Medrol Dosepak and some topical steroid to help with the itching but if it is being caused by the oxycodone discontinuation should be self-limiting. He continues to have problems he should follow-up with the dermatologist.. 4. F/u prn if symptoms worsen or don't improve      Lorin Picket, PA-C 10/19/15 1618

## 2015-10-19 NOTE — Discharge Instructions (Signed)
Drug Rash °A drug rash is a change in the color or texture of the skin that is caused by a drug. It can develop minutes, hours, or days after the person takes the drug. °CAUSES °This condition is usually caused by a drug allergy. It can also be caused by exposure to sunlight after taking a drug that makes the skin sensitive to light. Drugs that commonly cause rashes include: °· Penicillin. °· Antibiotic medicines. °· Medicines that treat seizures. °· Medicines that treat cancer (chemotherapy). °· Aspirin and other nonsteroidal anti-inflammatory drugs (NSAIDs). °· Injectable dyes that contain iodine. °· Insulin. °SYMPTOMS °Symptoms of this condition include: °· Redness. °· Tiny bumps. °· Peeling. °· Itching. °· Itchy welts (hives). °· Swelling. °The rash may appear on a small area of skin or all over the body. °DIAGNOSIS °To diagnose the condition, your health care provider will do a physical exam. He or she may also order tests to find out which drug caused the rash. Tests to find the cause of a rash include: °· Skin tests. °· Blood tests. °· Drug challenge. For this test, you stop taking all of the drugs that you do not need to take, and then you start taking them again by adding back one of the drugs at a time. °TREATMENT °A drug rash may be treated with medicines, including: °· Antihistamines. These may be given to relieve itching. °· An NSAID. This may be given to reduce swelling and treat pain. °· A steroid drug. This may be given to reduce swelling. °The rash usually goes away when the person stops taking the drug that caused it. °HOME CARE INSTRUCTIONS °· Take medicines only as directed by your health care provider. °· Let all of your health care providers know about any drug reactions you have had in the past. °· If you have hives, take a cool shower or use a cool compress to relieve itchiness. °SEEK MEDICAL CARE IF: °· You have a fever. °· Your rash is not going away. °· Your rash gets worse. °· Your rash  comes back. °· You have wheezing or coughing. °SEEK IMMEDIATE MEDICAL CARE IF: °· You start to have breathing problems. °· You start to have shortness of breath. °· You face or throat starts to swell. °· You have severe weakness with dizziness or fainting. °· You have chest pain. °  °This information is not intended to replace advice given to you by your health care provider. Make sure you discuss any questions you have with your health care provider. °  °Document Released: 07/24/2004 Document Revised: 07/07/2014 Document Reviewed: 04/12/2014 °Elsevier Interactive Patient Education ©2016 [employer] ° °

## 2015-10-19 NOTE — ED Notes (Signed)
Patient c/o itching all over his body x 4 days. Per patient s/p left hand surgery on 07/04/2015. Patient stated after cast has remove x 4 days he has been having itching all over his body. Per pt. Not sure if he is having an allergy reaction. Patient stated he took benadryl yesterday and today but did not help.

## 2016-01-28 DIAGNOSIS — M79645 Pain in left finger(s): Secondary | ICD-10-CM | POA: Diagnosis not present

## 2016-01-28 DIAGNOSIS — Z4789 Encounter for other orthopedic aftercare: Secondary | ICD-10-CM | POA: Diagnosis not present

## 2016-05-09 DIAGNOSIS — G4733 Obstructive sleep apnea (adult) (pediatric): Secondary | ICD-10-CM | POA: Diagnosis not present

## 2016-06-13 DIAGNOSIS — M65342 Trigger finger, left ring finger: Secondary | ICD-10-CM | POA: Diagnosis not present

## 2016-06-13 DIAGNOSIS — M79645 Pain in left finger(s): Secondary | ICD-10-CM | POA: Diagnosis not present

## 2016-06-24 DIAGNOSIS — E7800 Pure hypercholesterolemia, unspecified: Secondary | ICD-10-CM | POA: Diagnosis not present

## 2016-06-24 DIAGNOSIS — Z882 Allergy status to sulfonamides status: Secondary | ICD-10-CM | POA: Diagnosis not present

## 2016-06-24 DIAGNOSIS — Z87891 Personal history of nicotine dependence: Secondary | ICD-10-CM | POA: Diagnosis not present

## 2016-06-24 DIAGNOSIS — K219 Gastro-esophageal reflux disease without esophagitis: Secondary | ICD-10-CM | POA: Diagnosis not present

## 2016-06-24 DIAGNOSIS — M65332 Trigger finger, left middle finger: Secondary | ICD-10-CM | POA: Insufficient documentation

## 2016-06-24 DIAGNOSIS — M65342 Trigger finger, left ring finger: Secondary | ICD-10-CM | POA: Diagnosis not present

## 2016-06-24 DIAGNOSIS — E669 Obesity, unspecified: Secondary | ICD-10-CM | POA: Diagnosis not present

## 2016-06-24 DIAGNOSIS — M189 Osteoarthritis of first carpometacarpal joint, unspecified: Secondary | ICD-10-CM | POA: Diagnosis not present

## 2016-06-24 DIAGNOSIS — G4730 Sleep apnea, unspecified: Secondary | ICD-10-CM | POA: Diagnosis not present

## 2016-06-24 DIAGNOSIS — Z880 Allergy status to penicillin: Secondary | ICD-10-CM | POA: Diagnosis not present

## 2016-08-08 DIAGNOSIS — M79645 Pain in left finger(s): Secondary | ICD-10-CM | POA: Diagnosis not present

## 2016-09-09 ENCOUNTER — Ambulatory Visit
Admission: EM | Admit: 2016-09-09 | Discharge: 2016-09-09 | Disposition: A | Discharge status: Home / Self Care | Payer: Medicare Other | Attending: Family Medicine | Admitting: Family Medicine

## 2016-09-09 ENCOUNTER — Encounter: Payer: Self-pay | Admitting: *Deleted

## 2016-09-09 DIAGNOSIS — H00014 Hordeolum externum left upper eyelid: Secondary | ICD-10-CM | POA: Diagnosis not present

## 2016-09-09 DIAGNOSIS — L03213 Periorbital cellulitis: Secondary | ICD-10-CM | POA: Diagnosis not present

## 2016-09-09 MED ORDER — CEPHALEXIN 500 MG PO CAPS: 500.0000 mg | ORAL_CAPSULE | Freq: Four times a day (QID) | ORAL | 0 refills | Status: DC

## 2016-09-09 NOTE — ED Provider Notes (Signed)
[facility]    CSN: [id number] Arrival date & time: 09/09/16  1152     History   Chief Complaint Chief Complaint  Patient presents with  . Otalgia  . Eye Problem    HPI Manuel Beard is a 71 y.o. male.   71 yo male with a 3 days h/o left upper eyelid bump, redness, swelling and tenderness. Denies any fevers, chills, drainage, eye injury/trauma, drainage, vision changes.   The history is provided by the patient.  Otalgia  Eye Problem    Past Medical History:  Diagnosis Date  . Arthritis    thumbs  . GERD (gastroesophageal reflux disease)    rare  . Headache    sinus  . Motion sickness    amusement park rides    There are no active problems to display for this patient.   Past Surgical History:  Procedure Laterality Date  . FRONTAL SINUS EXPLORATION Bilateral 01/04/2015   Procedure: FRONTAL SINUS EXPLORATION;  Surgeon: Margaretha Sheffield, MD;  Location: [place];  Service: ENT;  Laterality: Bilateral;  . IMAGE GUIDED SINUS SURGERY N/A 01/04/2015   Procedure: IMAGE GUIDED SINUS SURGERY;  Surgeon: Margaretha Sheffield, MD;  Location: [place];  Service: ENT;  Laterality: N/A;  GAVE DISK TO CE CE  . KNEE SURGERY Right    x1  . KNEE SURGERY Left    x2  . SEPTOPLASTY WITH ETHMOIDECTOMY, AND MAXILLARY ANTROSTOMY Bilateral 01/04/2015   Procedure: SEPTOPLASTY WITH ETHMOIDECTOMY, AND MAXILLARY ANTROSTOMY;  Surgeon: Margaretha Sheffield, MD;  Location: [place];  Service: ENT;  Laterality: Bilateral;  . SPHENOIDECTOMY Bilateral 01/04/2015   Procedure: SPHENOIDECTOMY;  Surgeon: Margaretha Sheffield, MD;  Location: [place];  Service: ENT;  Laterality: Bilateral;  . TURBINATE REDUCTION Bilateral 01/04/2015   Procedure: INFERIOR TURBINATE REDUCTION ;  Surgeon: Margaretha Sheffield, MD;  Location: [place];  Service: ENT;  Laterality: Bilateral;  . WRIST ARTHROSCOPY WITH CARPOMETACARPEL [facility]) ARTHROPLASTY  10/02/2015   reposition interposition arthroplasty.        Home Medications    Prior to Admission medications   Medication Sig Start Date End Date Taking? Authorizing Provider  Ca Carbonate-Mag Hydroxide (ROLAIDS PO) Take by mouth as needed.   Yes Historical Provider, MD  omeprazole (PRILOSEC) 20 MG capsule Take 20 mg by mouth daily as needed.    Yes Historical Provider, MD  cephALEXin (KEFLEX) 500 MG capsule Take 1 capsule (500 mg total) by mouth 4 (four) times daily. 09/09/16   Norval Gable, MD  methylPREDNISolone (MEDROL DOSEPAK) 4 MG TBPK tablet Take per package instructions 10/19/15   Lorin Picket, PA-C  oxyCODONE-acetaminophen (PERCOCET/ROXICET) 5-325 MG tablet Take by mouth every 4 (four) hours as needed for severe pain.    Historical Provider, MD  triamcinolone (NASACORT AQ) 55 MCG/ACT AERO nasal inhaler Place 2 sprays into the nose daily. AM    Historical Provider, MD  triamcinolone cream (KENALOG) 0.1 % Apply 1 application topically 2 (two) times daily. 10/19/15   Lorin Picket, PA-C    Family History History reviewed. No pertinent family history.  Social History Social History  Substance Use Topics  . Smoking status: Former Research scientist (life sciences)  . Smokeless tobacco: Never Used  . Alcohol use Yes     Comment: occassionally     Allergies   Sulfa antibiotics; Erythromycin; Iron supplement [ferrous sulfate]; and Penicillins   Review of Systems Review of Systems  HENT: Positive for ear pain.      Physical Exam  Triage Vital Signs ED Triage Vitals  Enc Vitals Group     BP 09/09/16 1219 139/90     Pulse Rate 09/09/16 1219 66     Resp 09/09/16 1219 16     Temp 09/09/16 1219 97.5 F (36.4 C)     Temp Source 09/09/16 1219 Oral     SpO2 09/09/16 1219 98 %     Weight 09/09/16 1221 235 lb (106.6 kg)     Height 09/09/16 1221 5\' 11"  (1.803 m)     Head Circumference --      Peak Flow --      Pain Score 09/09/16 1226 0     Pain Loc --      Pain Edu? --      Excl. in [place]? --    No data found.   Updated Vital Signs BP  139/90 (BP Location: Left Arm)   Pulse 66   Temp 97.5 F (36.4 C) (Oral)   Resp 16   Ht 5\' 11"  (1.803 m)   Wt 235 lb (106.6 kg)   SpO2 98%   BMI 32.78 kg/m   Visual Acuity Right Eye Distance:   Left Eye Distance:   Bilateral Distance:    Right Eye Near:   Left Eye Near:    Bilateral Near:     Physical Exam  Constitutional: He appears well-developed and well-nourished. No distress.  Eyes: Conjunctivae and EOM are normal. Pupils are equal, round, and reactive to light. Left eye exhibits hordeolum (on upper eyelid with surrounding skin edema, warmth and blanchable erythema).  Skin: He is not diaphoretic.  Nursing note and vitals reviewed.    UC Treatments / Results  Labs (all labs ordered are listed, but only abnormal results are displayed) Labs Reviewed - No data to display  EKG  EKG Interpretation None       Radiology No results found.  Procedures Procedures (including critical care time)  Medications Ordered in UC Medications - No data to display   Initial Impression / Assessment and Plan / UC Course  I have reviewed the triage vital signs and the nursing notes.  Pertinent labs & imaging results that were available during my care of the patient were reviewed by me and considered in my medical decision making (see chart for details).       Final Clinical Impressions(s) / UC Diagnoses   Final diagnoses:  Preseptal cellulitis of left eye  Hordeolum externum of left upper eyelid    New Prescriptions Discharge Medication List as of 09/09/2016  1:43 PM     1. diagnosis reviewed with patient 2. rx as per orders above; reviewed possible side effects, interactions, risks and benefits; rx for keflex as per orders  3. Recommend supportive treatment with warm compresses to area 4. Follow-up prn if symptoms worsen or don't improve   Norval Gable, MD 09/09/16 [phone]

## 2016-09-09 NOTE — ED Triage Notes (Signed)
Patient is presently taking Amoxicillin for 1 day.

## 2016-09-09 NOTE — ED Triage Notes (Signed)
Patient started having right ear pain and left upper eyelid swelling 2 days ago.

## 2016-09-23 DIAGNOSIS — E669 Obesity, unspecified: Secondary | ICD-10-CM | POA: Diagnosis not present

## 2016-09-23 DIAGNOSIS — Z882 Allergy status to sulfonamides status: Secondary | ICD-10-CM | POA: Diagnosis not present

## 2016-09-23 DIAGNOSIS — M65342 Trigger finger, left ring finger: Secondary | ICD-10-CM | POA: Diagnosis not present

## 2016-09-23 DIAGNOSIS — E7800 Pure hypercholesterolemia, unspecified: Secondary | ICD-10-CM | POA: Diagnosis not present

## 2016-09-23 DIAGNOSIS — G8918 Other acute postprocedural pain: Secondary | ICD-10-CM | POA: Diagnosis not present

## 2016-09-23 DIAGNOSIS — G4730 Sleep apnea, unspecified: Secondary | ICD-10-CM | POA: Diagnosis not present

## 2016-09-23 DIAGNOSIS — K219 Gastro-esophageal reflux disease without esophagitis: Secondary | ICD-10-CM | POA: Diagnosis not present

## 2016-09-23 DIAGNOSIS — Z9989 Dependence on other enabling machines and devices: Secondary | ICD-10-CM | POA: Diagnosis not present

## 2016-09-23 DIAGNOSIS — G4733 Obstructive sleep apnea (adult) (pediatric): Secondary | ICD-10-CM | POA: Diagnosis not present

## 2016-09-23 DIAGNOSIS — Z79899 Other long term (current) drug therapy: Secondary | ICD-10-CM | POA: Diagnosis not present

## 2016-09-23 DIAGNOSIS — M1990 Unspecified osteoarthritis, unspecified site: Secondary | ICD-10-CM | POA: Diagnosis not present

## 2016-09-23 DIAGNOSIS — Z87891 Personal history of nicotine dependence: Secondary | ICD-10-CM | POA: Diagnosis not present

## 2016-09-23 DIAGNOSIS — E663 Overweight: Secondary | ICD-10-CM | POA: Diagnosis not present

## 2016-09-23 DIAGNOSIS — M1811 Unilateral primary osteoarthritis of first carpometacarpal joint, right hand: Secondary | ICD-10-CM | POA: Diagnosis not present

## 2016-09-23 DIAGNOSIS — Z880 Allergy status to penicillin: Secondary | ICD-10-CM | POA: Diagnosis not present

## 2017-07-31 DIAGNOSIS — M65332 Trigger finger, left middle finger: Secondary | ICD-10-CM | POA: Diagnosis not present

## 2017-07-31 DIAGNOSIS — M25541 Pain in joints of right hand: Secondary | ICD-10-CM | POA: Diagnosis not present

## 2017-07-31 DIAGNOSIS — M79645 Pain in left finger(s): Secondary | ICD-10-CM | POA: Diagnosis not present

## 2017-07-31 DIAGNOSIS — M65352 Trigger finger, left little finger: Secondary | ICD-10-CM | POA: Diagnosis not present

## 2017-08-18 DIAGNOSIS — G4733 Obstructive sleep apnea (adult) (pediatric): Secondary | ICD-10-CM | POA: Diagnosis not present

## 2017-08-18 DIAGNOSIS — M65332 Trigger finger, left middle finger: Secondary | ICD-10-CM | POA: Diagnosis not present

## 2017-08-18 DIAGNOSIS — Z6833 Body mass index (BMI) 33.0-33.9, adult: Secondary | ICD-10-CM | POA: Diagnosis not present

## 2017-08-18 DIAGNOSIS — K219 Gastro-esophageal reflux disease without esophagitis: Secondary | ICD-10-CM | POA: Diagnosis not present

## 2017-08-18 DIAGNOSIS — M1812 Unilateral primary osteoarthritis of first carpometacarpal joint, left hand: Secondary | ICD-10-CM | POA: Diagnosis not present

## 2017-08-18 DIAGNOSIS — Z87891 Personal history of nicotine dependence: Secondary | ICD-10-CM | POA: Diagnosis not present

## 2017-08-18 DIAGNOSIS — E7800 Pure hypercholesterolemia, unspecified: Secondary | ICD-10-CM | POA: Diagnosis not present

## 2017-08-18 DIAGNOSIS — E669 Obesity, unspecified: Secondary | ICD-10-CM | POA: Diagnosis not present

## 2017-08-18 DIAGNOSIS — M65352 Trigger finger, left little finger: Secondary | ICD-10-CM | POA: Diagnosis not present

## 2017-08-18 DIAGNOSIS — M65331 Trigger finger, right middle finger: Secondary | ICD-10-CM | POA: Diagnosis not present

## 2017-08-18 DIAGNOSIS — Z880 Allergy status to penicillin: Secondary | ICD-10-CM | POA: Diagnosis not present

## 2017-08-18 DIAGNOSIS — Z882 Allergy status to sulfonamides status: Secondary | ICD-10-CM | POA: Diagnosis not present

## 2017-08-18 DIAGNOSIS — Z881 Allergy status to other antibiotic agents status: Secondary | ICD-10-CM | POA: Diagnosis not present

## 2017-08-31 DIAGNOSIS — R635 Abnormal weight gain: Secondary | ICD-10-CM | POA: Diagnosis not present

## 2017-08-31 DIAGNOSIS — Z13220 Encounter for screening for lipoid disorders: Secondary | ICD-10-CM | POA: Diagnosis not present

## 2017-08-31 DIAGNOSIS — Z131 Encounter for screening for diabetes mellitus: Secondary | ICD-10-CM | POA: Diagnosis not present

## 2017-08-31 DIAGNOSIS — Z1371 Encounter for nonprocreative screening for genetic disease carrier status: Secondary | ICD-10-CM | POA: Diagnosis not present

## 2017-08-31 DIAGNOSIS — M2550 Pain in unspecified joint: Secondary | ICD-10-CM | POA: Diagnosis not present

## 2017-08-31 DIAGNOSIS — E785 Hyperlipidemia, unspecified: Secondary | ICD-10-CM | POA: Diagnosis not present

## 2017-11-09 DIAGNOSIS — Z23 Encounter for immunization: Secondary | ICD-10-CM | POA: Diagnosis not present

## 2017-12-14 DIAGNOSIS — Z86010 Personal history of colonic polyps: Secondary | ICD-10-CM | POA: Diagnosis not present

## 2018-02-08 ENCOUNTER — Encounter: Payer: Self-pay | Admitting: *Deleted

## 2018-02-09 ENCOUNTER — Ambulatory Visit
Admission: RE | Admit: 2018-02-09 | Discharge: 2018-02-09 | Disposition: A | Discharge status: Home / Self Care | Payer: Medicare Other | Source: Ambulatory Visit | Attending: Gastroenterology | Admitting: Gastroenterology

## 2018-02-09 ENCOUNTER — Encounter
Admission: RE | Disposition: A | Discharge status: Home / Self Care | Payer: Self-pay | Source: Ambulatory Visit | Attending: Gastroenterology

## 2018-02-09 ENCOUNTER — Ambulatory Visit: Payer: Medicare Other | Admitting: Certified Registered"

## 2018-02-09 ENCOUNTER — Encounter: Payer: Self-pay | Admitting: Certified Registered"

## 2018-02-09 DIAGNOSIS — Z880 Allergy status to penicillin: Secondary | ICD-10-CM | POA: Diagnosis not present

## 2018-02-09 DIAGNOSIS — Z882 Allergy status to sulfonamides status: Secondary | ICD-10-CM | POA: Insufficient documentation

## 2018-02-09 DIAGNOSIS — Z800 Family history of malignant neoplasm of digestive organs: Secondary | ICD-10-CM | POA: Insufficient documentation

## 2018-02-09 DIAGNOSIS — M180 Bilateral primary osteoarthritis of first carpometacarpal joints: Secondary | ICD-10-CM | POA: Insufficient documentation

## 2018-02-09 DIAGNOSIS — Z86010 Personal history of colonic polyps: Secondary | ICD-10-CM | POA: Insufficient documentation

## 2018-02-09 DIAGNOSIS — K5730 Diverticulosis of large intestine without perforation or abscess without bleeding: Secondary | ICD-10-CM | POA: Insufficient documentation

## 2018-02-09 DIAGNOSIS — K648 Other hemorrhoids: Secondary | ICD-10-CM | POA: Insufficient documentation

## 2018-02-09 DIAGNOSIS — D123 Benign neoplasm of transverse colon: Secondary | ICD-10-CM | POA: Insufficient documentation

## 2018-02-09 DIAGNOSIS — Z79899 Other long term (current) drug therapy: Secondary | ICD-10-CM | POA: Insufficient documentation

## 2018-02-09 DIAGNOSIS — Z888 Allergy status to other drugs, medicaments and biological substances status: Secondary | ICD-10-CM | POA: Diagnosis not present

## 2018-02-09 DIAGNOSIS — Z1211 Encounter for screening for malignant neoplasm of colon: Secondary | ICD-10-CM | POA: Insufficient documentation

## 2018-02-09 DIAGNOSIS — Z87891 Personal history of nicotine dependence: Secondary | ICD-10-CM | POA: Diagnosis not present

## 2018-02-09 DIAGNOSIS — G4730 Sleep apnea, unspecified: Secondary | ICD-10-CM | POA: Diagnosis not present

## 2018-02-09 DIAGNOSIS — K219 Gastro-esophageal reflux disease without esophagitis: Secondary | ICD-10-CM | POA: Diagnosis not present

## 2018-02-09 DIAGNOSIS — K635 Polyp of colon: Secondary | ICD-10-CM | POA: Diagnosis not present

## 2018-02-09 DIAGNOSIS — K621 Rectal polyp: Secondary | ICD-10-CM | POA: Insufficient documentation

## 2018-02-09 DIAGNOSIS — D128 Benign neoplasm of rectum: Secondary | ICD-10-CM | POA: Diagnosis not present

## 2018-02-09 DIAGNOSIS — Z881 Allergy status to other antibiotic agents status: Secondary | ICD-10-CM | POA: Diagnosis not present

## 2018-02-09 DIAGNOSIS — K5790 Diverticulosis of intestine, part unspecified, without perforation or abscess without bleeding: Secondary | ICD-10-CM | POA: Diagnosis not present

## 2018-02-09 HISTORY — PX: COLONOSCOPY WITH PROPOFOL: SHX5780

## 2018-02-09 SURGERY — COLONOSCOPY WITH PROPOFOL
Anesthesia: General

## 2018-02-09 MED ORDER — LIDOCAINE HCL (PF) 2 % IJ SOLN
INTRAMUSCULAR | Status: AC
  Filled 2018-02-09: qty 10

## 2018-02-09 MED ORDER — PROPOFOL 10 MG/ML IV BOLUS
INTRAVENOUS | Status: DC | PRN
  Administered 2018-02-09: 100 mg via INTRAVENOUS
  Administered 2018-02-09: 50 mg via INTRAVENOUS

## 2018-02-09 MED ORDER — GLYCOPYRROLATE 0.2 MG/ML IJ SOLN
INTRAMUSCULAR | Status: DC | PRN
  Administered 2018-02-09: 0.2 mg via INTRAVENOUS

## 2018-02-09 MED ORDER — LIDOCAINE HCL (CARDIAC) PF 100 MG/5ML IV SOSY
PREFILLED_SYRINGE | INTRAVENOUS | Status: DC | PRN
  Administered 2018-02-09: 60 mg via INTRAVENOUS

## 2018-02-09 MED ORDER — LIDOCAINE HCL (PF) 1 % IJ SOLN
INTRAMUSCULAR | Status: AC
  Administered 2018-02-09: 0.3 mL via INTRADERMAL
  Filled 2018-02-09: qty 2

## 2018-02-09 MED ORDER — SODIUM CHLORIDE 0.9 % IV SOLN
INTRAVENOUS | Status: DC
  Administered 2018-02-09: 1000 mL via INTRAVENOUS

## 2018-02-09 MED ORDER — SODIUM CHLORIDE 0.9 % IV SOLN
INTRAVENOUS | Status: DC
  Administered 2018-02-09: 13:00:00 via INTRAVENOUS

## 2018-02-09 MED ORDER — PROPOFOL 10 MG/ML IV BOLUS
INTRAVENOUS | Status: AC
  Filled 2018-02-09: qty 20

## 2018-02-09 MED ORDER — GLYCOPYRROLATE 0.2 MG/ML IJ SOLN
INTRAMUSCULAR | Status: AC
  Filled 2018-02-09: qty 1

## 2018-02-09 MED ORDER — PROPOFOL 500 MG/50ML IV EMUL
INTRAVENOUS | Status: AC
  Filled 2018-02-09: qty 50

## 2018-02-09 MED ORDER — LIDOCAINE HCL URETHRAL/MUCOSAL 2 % EX GEL
CUTANEOUS | Status: AC
  Filled 2018-02-09: qty 5

## 2018-02-09 MED ORDER — PROPOFOL 500 MG/50ML IV EMUL
INTRAVENOUS | Status: DC | PRN
  Administered 2018-02-09: 125 ug/kg/min via INTRAVENOUS

## 2018-02-09 MED ORDER — LIDOCAINE HCL (PF) 1 % IJ SOLN
2.0000 mL | Freq: Once | INTRAMUSCULAR | Status: AC
  Administered 2018-02-09: 0.3 mL via INTRADERMAL

## 2018-02-09 NOTE — Anesthesia Preprocedure Evaluation (Signed)
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:   Pulmonary neg shortness of breath, sleep apnea , neg COPD, neg recent URI, former smoker,           Cardiovascular Exercise Tolerance: Good negative cardio ROS       Neuro/Psych negative neurological ROS  negative psych ROS   GI/Hepatic Neg liver ROS, GERD  ,  Endo/Other  negative endocrine ROS  Renal/GU negative Renal ROS  negative genitourinary   Musculoskeletal   Abdominal   Peds  Hematology negative hematology ROS (+)   Anesthesia Other Findings Past Medical History: No date: Arthritis     Comment:  thumbs No date: GERD (gastroesophageal reflux disease)     Comment:  rare No date: Headache     Comment:  sinus No date: Motion sickness     Comment:  amusement park rides   Reproductive/Obstetrics negative OB ROS                             Anesthesia Physical Anesthesia Plan  ASA: II  Anesthesia Plan: General   Post-op Pain Management:    Induction: Intravenous  PONV Risk Score and Plan: 2 and Propofol infusion and TIVA  Airway Management Planned: Nasal Cannula  Additional Equipment:   Intra-op Plan:   Post-operative Plan:   Informed Consent: I have reviewed the patients History and Physical, chart, labs and discussed the procedure including the risks, benefits and alternatives for the proposed anesthesia with the patient or authorized representative who has  indicated his/her understanding and acceptance.   Dental Advisory Given  Plan Discussed with: Anesthesiologist, CRNA and Surgeon  Anesthesia Plan Comments:         Anesthesia Quick Evaluation

## 2018-02-09 NOTE — Anesthesia Postprocedure Evaluation (Signed)
Anesthesia Post Note  Patient: Manuel Beard  Procedure(s) Performed: COLONOSCOPY WITH PROPOFOL (N/A )  Patient location during evaluation: Endoscopy Anesthesia Type: General Level of consciousness: awake and alert, oriented and patient cooperative Pain management: satisfactory to patient Vital Signs Assessment: post-procedure vital signs reviewed and stable Respiratory status: spontaneous breathing and respiratory function stable Cardiovascular status: blood pressure returned to baseline and stable Postop Assessment: no headache, no backache, patient able to bend at knees, no apparent nausea or vomiting, adequate PO intake and able to ambulate Anesthetic complications: no     Last Vitals:  Vitals:   02/09/18 1246 02/09/18 1420  BP: 124/87 139/90  Pulse: 60   Resp: 17   Temp: (!) 35.9 C (!) 36.4 C  SpO2: 98% 96%    Last Pain:  Vitals:   02/09/18 1420  TempSrc:   PainSc: Asleep                 Wava Kildow H Kenzleigh Sedam

## 2018-02-09 NOTE — Transfer of Care (Signed)
Immediate Anesthesia Transfer of Care Note  Patient: Manuel Beard  Procedure(s) Performed: COLONOSCOPY WITH PROPOFOL (N/A )  Patient Location: PACU and Endoscopy Unit  Anesthesia Type:General  Level of Consciousness: drowsy and patient cooperative  Airway & Oxygen Therapy: Patient Spontanous Breathing  Post-op Assessment: Report given to RN, Post -op Vital signs reviewed and stable and Patient moving all extremities  Post vital signs: Reviewed and stable  Last Vitals:  Vitals Value Taken Time  BP 139/90 02/09/2018  2:20 PM  Temp 36.4 C 02/09/2018  2:20 PM  Pulse 73 02/09/2018  2:20 PM  Resp 17 02/09/2018  2:20 PM  SpO2 99 % 02/09/2018  2:20 PM  Vitals shown include unvalidated device data.  Last Pain:  Vitals:   02/09/18 1246  TempSrc: Tympanic  PainSc: 0-No pain         Complications: No apparent anesthesia complications

## 2018-02-09 NOTE — Op Note (Signed)
[facility] Gastroenterology Patient Name: Manuel Beard Procedure Date: 02/09/2018 1:19 PM MRN: [id number] Account #: [id number] Date of Birth: [birth year] Admit Type: Inpatient Age: 72 Room: [facility] ENDO ROOM 3 Gender: Male Note Status: Finalized Procedure:            Colonoscopy Indications:          Family history of colon cancer in a first-degree                        relative, Personal history of colonic polyps Providers:            Lollie Sails, MD Referring MD:         Lynnell Jude (Referring MD) Medicines:            Monitored Anesthesia Care Complications:        No immediate complications. Procedure:            Pre-Anesthesia Assessment:                       - ASA Grade Assessment: III - A patient with severe                        systemic disease.                       After obtaining informed consent, the colonoscope was                        passed under direct vision. Throughout the procedure,                        the patient's blood pressure, pulse, and oxygen                        saturations were monitored continuously. The                        Colonoscope was introduced through the anus and                        advanced to the the cecum, identified by appendiceal                        orifice and ileocecal valve. The colonoscopy was                        unusually difficult due to poor bowel prep, significant                        looping and a tortuous colon. Successful completion of                        the procedure was aided by changing the patient to a                        supine position, changing the patient to a prone                        position, using manual pressure and lavage. The patient  tolerated the procedure well. The quality of the bowel                        preparation was fair. Findings:      Multiple medium-mouthed diverticula were found in the sigmoid colon,       descending  colon, transverse colon and ascending colon.      A 3 mm polyp was found in the rectum. The polyp was sessile. The polyp       was removed with a cold biopsy forceps. Resection and retrieval were       complete.      A 4 mm polyp was found in the hepatic flexure. The polyp was sessile.       The polyp was removed with a piecemeal technique using a cold biopsy       forceps. Resection and retrieval were complete.      Two sessile polyps were found in the hepatic flexure. The polyps were 3       to 7 mm in size. These polyps were removed with a cold snare. Resection       and retrieval were complete.      Non-bleeding internal hemorrhoids were found during retroflexion. The       hemorrhoids were small.      No additional abnormalities were found on retroflexion.      The digital rectal exam was normal, note evidence of old healed fissures       in the anterior and posterior midline. . Impression:           - Preparation of the colon was fair.                       - Diverticulosis in the sigmoid colon, in the                        descending colon, in the transverse colon and in the                        ascending colon.                       - One 3 mm polyp in the rectum, removed with a cold                        biopsy forceps. Resected and retrieved.                       - One 4 mm polyp at the hepatic flexure, removed                        piecemeal using a cold biopsy forceps. Resected and                        retrieved.                       - Two 3 to 7 mm polyps at the hepatic flexure, removed                        with a cold snare. Resected and retrieved.                       -  Non-bleeding internal hemorrhoids. Recommendation:       - Discharge patient to home.                       - Await pathology results.                       - Telephone GI clinic for pathology results in 1 week. Procedure Code(s):    --- Professional ---                       [phone],  Colonoscopy, flexible; with removal of tumor(s),                        polyp(s), or other lesion(s) by snare technique                       45380, 53, Colonoscopy, flexible; with biopsy, single                        or multiple Diagnosis Code(s):    --- Professional ---                       K64.8, Other hemorrhoids                       K62.1, Rectal polyp                       D12.3, Benign neoplasm of transverse colon (hepatic                        flexure or splenic flexure)                       Z80.0, Family history of malignant neoplasm of                        digestive organs                       Z86.010, Personal history of colonic polyps                       K57.30, Diverticulosis of large intestine without                        perforation or abscess without bleeding CPT copyright 2017 American Medical Association. All rights reserved. The codes documented in this report are preliminary and upon coder review may  be revised to meet current compliance requirements. Lollie Sails, MD 02/09/2018 2:20:42 PM This report has been signed electronically. Number of Addenda: 0 Note Initiated On: 02/09/2018 1:19 PM Scope Withdrawal Time: 0 hours 9 minutes 8 seconds  Total Procedure Duration: 0 hours 47 minutes 38 seconds       [facility]

## 2018-02-09 NOTE — H&P (Addendum)
Outpatient short stay form Pre-procedure 02/09/2018 1:07 PM Lollie Sails MD  Primary Physician: Johnnye Sima MD  Reason for visit: Colonoscopy  History of present illness: Patient is a 72 year old male presenting today as above.  He has a personal history of colon polyps.  His last colonoscopy was about 5 years ago.  Tolerated his prep well.  He takes no aspirin or blood thinning agent.  There is also a family history of colon cancer primary relative, mother.    Current Facility-Administered Medications:  .  0.9 %  sodium chloride infusion, , Intravenous, Continuous, Lollie Sails, MD .  0.9 %  sodium chloride infusion, , Intravenous, Continuous, Lollie Sails, MD, Last Rate: 20 mL/hr at 02/09/18 1305, 1,000 mL at 02/09/18 1305  Medications Prior to Admission  Medication Sig Dispense Refill Last Dose  . Ca Carbonate-Mag Hydroxide (ROLAIDS PO) Take by mouth as needed.   Past Week at Unknown time  . cephALEXin (KEFLEX) 500 MG capsule Take 1 capsule (500 mg total) by mouth 4 (four) times daily. 28 capsule 0   . methylPREDNISolone (MEDROL DOSEPAK) 4 MG TBPK tablet Take per package instructions 21 tablet 0   . omeprazole (PRILOSEC) 20 MG capsule Take 20 mg by mouth daily as needed.    Past Month at Unknown time  . oxyCODONE-acetaminophen (PERCOCET/ROXICET) 5-325 MG tablet Take by mouth every 4 (four) hours as needed for severe pain.     Marland Kitchen triamcinolone (NASACORT AQ) 55 MCG/ACT AERO nasal inhaler Place 2 sprays into the nose daily. AM   01/03/2015 at 0900  . triamcinolone cream (KENALOG) 0.1 % Apply 1 application topically 2 (two) times daily. 30 g 0      Allergies  Allergen Reactions  . Sulfa Antibiotics Rash  . Erythromycin Hives  . Iron Supplement [Ferrous Sulfate] Other (See Comments)    Facial blisters  . Penicillins     Childhood allergy.     Past Medical History:  Diagnosis Date  . Arthritis    thumbs  . GERD (gastroesophageal reflux disease)    rare  .  Headache    sinus  . Motion sickness    amusement park rides    Review of systems:      Physical Exam    Heart and lungs: Regular rate and rhythm without rub or gallop, lungs are bilaterally clear    HEENT: Cephalic atraumatic eyes are anicteric    Other:    Pertinant exam for procedure: Soft nontender nondistended bowel sounds positive normoactive    Planned proceedures: Colonoscopy and indicated procedures. I have discussed the risks benefits and complications of procedures to include not limited to bleeding, infection, perforation and the risk of sedation and the patient wishes to proceed.    Lollie Sails, MD Gastroenterology 02/09/2018  1:07 PM

## 2018-02-09 NOTE — Anesthesia Post-op Follow-up Note (Signed)
Anesthesia QCDR form completed.        

## 2018-02-10 DIAGNOSIS — H6123 Impacted cerumen, bilateral: Secondary | ICD-10-CM | POA: Diagnosis not present

## 2018-02-10 DIAGNOSIS — H903 Sensorineural hearing loss, bilateral: Secondary | ICD-10-CM | POA: Diagnosis not present

## 2018-02-11 LAB — SURGICAL PATHOLOGY

## 2018-02-15 ENCOUNTER — Encounter: Payer: Self-pay | Admitting: Gastroenterology

## 2018-08-05 ENCOUNTER — Other Ambulatory Visit: Payer: Self-pay

## 2018-08-05 ENCOUNTER — Ambulatory Visit
Admission: EM | Admit: 2018-08-05 | Discharge: 2018-08-05 | Disposition: A | Discharge status: Home / Self Care | Payer: Medicare Other | Attending: Family Medicine | Admitting: Family Medicine

## 2018-08-05 ENCOUNTER — Encounter: Payer: Self-pay | Admitting: Emergency Medicine

## 2018-08-05 DIAGNOSIS — H04302 Unspecified dacryocystitis of left lacrimal passage: Secondary | ICD-10-CM | POA: Diagnosis not present

## 2018-08-05 MED ORDER — POLYMYXIN B-TRIMETHOPRIM 10000-0.1 UNIT/ML-% OP SOLN: 1.0000 [drp] | Freq: Four times a day (QID) | OPHTHALMIC | 0 refills | Status: AC

## 2018-08-05 NOTE — ED Triage Notes (Signed)
Patient c/o left eye irritation. He states he has been rubbing his eye a lot and now has swelling to the left eye. Denies drainage from his eye.

## 2018-08-05 NOTE — ED Provider Notes (Signed)
[facility]    CSN: [id number] Arrival date & time: 08/05/18  1453  History   Chief Complaint Chief Complaint  Patient presents with  . Eye Problem   HPI  73 year old male presents with the above complaint.  2-3 day history of left eye irritation.  Redness and swelling medially as well as strange below the lower eyelid.  No drainage.  No eye redness.  He has tried Visine without relief.  He thinks this may have been exacerbated by environmental exposure recently.  No relieving factors.  No other complaints.  History reviewed and updated as below.  Past Medical History:  Diagnosis Date  . Arthritis    thumbs  . GERD (gastroesophageal reflux disease)    rare  . Headache    sinus  . Motion sickness    amusement park rides   Past Surgical History:  Procedure Laterality Date  . COLONOSCOPY WITH PROPOFOL N/A 02/09/2018   Procedure: COLONOSCOPY WITH PROPOFOL;  Surgeon: Lollie Sails, MD;  Location: [facility] ENDOSCOPY;  Service: Endoscopy;  Laterality: N/A;  . FRONTAL SINUS EXPLORATION Bilateral 01/04/2015   Procedure: FRONTAL SINUS EXPLORATION;  Surgeon: Margaretha Sheffield, MD;  Location: [place];  Service: ENT;  Laterality: Bilateral;  . IMAGE GUIDED SINUS SURGERY N/A 01/04/2015   Procedure: IMAGE GUIDED SINUS SURGERY;  Surgeon: Margaretha Sheffield, MD;  Location: [place];  Service: ENT;  Laterality: N/A;  GAVE DISK TO CE CE  . KNEE SURGERY Right    x1  . KNEE SURGERY Left    x2  . SEPTOPLASTY WITH ETHMOIDECTOMY, AND MAXILLARY ANTROSTOMY Bilateral 01/04/2015   Procedure: SEPTOPLASTY WITH ETHMOIDECTOMY, AND MAXILLARY ANTROSTOMY;  Surgeon: Margaretha Sheffield, MD;  Location: [place];  Service: ENT;  Laterality: Bilateral;  . SPHENOIDECTOMY Bilateral 01/04/2015   Procedure: SPHENOIDECTOMY;  Surgeon: Margaretha Sheffield, MD;  Location: [place];  Service: ENT;  Laterality: Bilateral;  . TURBINATE REDUCTION Bilateral 01/04/2015   Procedure: INFERIOR TURBINATE  REDUCTION ;  Surgeon: Margaretha Sheffield, MD;  Location: [place];  Service: ENT;  Laterality: Bilateral;  . WRIST ARTHROSCOPY WITH CARPOMETACARPEL [facility]) ARTHROPLASTY  10/02/2015   reposition interposition arthroplasty.   Home Medications    Prior to Admission medications   Medication Sig Start Date End Date Taking? Authorizing Provider  Ca Carbonate-Mag Hydroxide (ROLAIDS PO) Take by mouth as needed.   Yes [provider]  omeprazole (PRILOSEC) 20 MG capsule Take 20 mg by mouth daily as needed.    Yes [provider]  trimethoprim-polymyxin b (POLYTRIM) ophthalmic solution Place 1 drop into the left eye every 6 (six) hours for 5 days. 08/05/18 08/10/18  Coral Spikes, DO   Social History Social History   Tobacco Use  . Smoking status: Former Research scientist (life sciences)  . Smokeless tobacco: Never Used  Substance Use Topics  . Alcohol use: Yes    Comment: occassionally  . Drug use: No     Allergies   Sulfa antibiotics; Erythromycin; Iron supplement [ferrous sulfate]; and Penicillins   Review of Systems Review of Systems  Constitutional: Negative.   Eyes: Negative for discharge and redness.       Redness under the left lower eyelid. Medial swelling and redness.    Physical Exam Triage Vital Signs ED Triage Vitals  Enc Vitals Group     BP 08/05/18 1505 (!) 107/93     Pulse Rate 08/05/18 1505 61     Resp 08/05/18 1505 18     Temp 08/05/18  1505 98.2 F (36.8 C)     Temp Source 08/05/18 1505 Oral     SpO2 08/05/18 1505 97 %     Weight 08/05/18 1500 235 lb (106.6 kg)     Height 08/05/18 1500 5\' 11"  (1.803 m)     Head Circumference --      Peak Flow --      Pain Score 08/05/18 1500 0     Pain Loc --      Pain Edu? --      Excl. in [place]? --    Updated Vital Signs BP (!) 107/93 (BP Location: Left Arm)   Pulse 61   Temp 98.2 F (36.8 C) (Oral)   Resp 18   Ht 5\' 11"  (1.803 m)   Wt 106.6 kg   SpO2 97%   BMI 32.78 kg/m   Visual Acuity Right Eye Distance:   Left Eye  Distance:   Bilateral Distance:    Right Eye Near:   Left Eye Near:    Bilateral Near:     Physical Exam Vitals signs and nursing note reviewed.  Constitutional:      General: He is not in acute distress.    Appearance: Normal appearance.  HENT:     Head: Normocephalic and atraumatic.  Eyes:     General:        Right eye: No discharge.        Left eye: No discharge.     Conjunctiva/sclera: Conjunctivae normal.      Comments: Redness and swelling at the labelled location.  Cardiovascular:     Rate and Rhythm: Normal rate and regular rhythm.  Pulmonary:     Effort: Pulmonary effort is normal.     Breath sounds: Normal breath sounds.  Neurological:     Mental Status: He is alert.  Psychiatric:        Mood and Affect: Mood normal.        Behavior: Behavior normal.    UC Treatments / Results  Labs (all labs ordered are listed, but only abnormal results are displayed) Labs Reviewed - No data to display  EKG None  Radiology No results found.  Procedures Procedures (including critical care time)  Medications Ordered in UC Medications - No data to display  Initial Impression / Assessment and Plan / UC Course  I have reviewed the triage vital signs and the nursing notes.  Pertinent labs & imaging results that were available during my care of the patient were reviewed by me and considered in my medical decision making (see chart for details).    73 year old male presents with dacryocystitis. Treating with polytrim.   Final Clinical Impressions(s) / UC Diagnoses   Final diagnoses:  Dacryocystitis of left lacrimal sac   Discharge Instructions   None    ED Prescriptions    Medication Sig Dispense Auth. Provider   trimethoprim-polymyxin b (POLYTRIM) ophthalmic solution Place 1 drop into the left eye every 6 (six) hours for 5 days. 10 mL Coral Spikes, DO     Controlled Substance Prescriptions [place] Controlled Substance Registry consulted? Not Applicable     Coral Spikes, DO 08/05/18 1616

## 2019-06-26 DIAGNOSIS — U071 COVID-19: Secondary | ICD-10-CM | POA: Insufficient documentation

## 2019-06-26 DIAGNOSIS — J9601 Acute respiratory failure with hypoxia: Secondary | ICD-10-CM | POA: Insufficient documentation

## 2019-07-11 DIAGNOSIS — J189 Pneumonia, unspecified organism: Secondary | ICD-10-CM | POA: Insufficient documentation

## 2019-07-13 DIAGNOSIS — E871 Hypo-osmolality and hyponatremia: Secondary | ICD-10-CM | POA: Insufficient documentation

## 2019-07-13 DIAGNOSIS — Z742 Need for assistance at home and no other household member able to render care: Secondary | ICD-10-CM | POA: Insufficient documentation

## 2019-09-15 ENCOUNTER — Ambulatory Visit: Payer: Medicare Other | Attending: Internal Medicine

## 2019-09-24 ENCOUNTER — Ambulatory Visit: Payer: Medicare Other

## 2019-09-25 ENCOUNTER — Ambulatory Visit: Payer: Medicare Other | Attending: Internal Medicine

## 2019-09-25 DIAGNOSIS — Z23 Encounter for immunization: Secondary | ICD-10-CM

## 2019-09-25 NOTE — Progress Notes (Signed)
   Covid-19 Vaccination Clinic  Name:  Manuel Beard    MRN: [id number] DOB: 01-08-1946  09/25/2019  Manuel Beard was observed post Covid-19 immunization for 15 minutes without incident. He was provided with Vaccine Information Sheet and instruction to access the V-Safe system.   Manuel Beard was instructed to call 911 with any severe reactions post vaccine: Marland Kitchen Difficulty breathing  . Swelling of face and throat  . A fast heartbeat  . A bad rash all over body  . Dizziness and weakness   Immunizations Administered    Name Date Dose VIS Date Route   Pfizer COVID-19 Vaccine 09/25/2019  2:43 PM 0.3 mL 06/10/2019 Intramuscular   Manufacturer: [place]   Lot: [id number]   [place]: [id number]

## 2019-10-19 ENCOUNTER — Ambulatory Visit: Payer: Medicare Other | Attending: Internal Medicine

## 2019-10-19 DIAGNOSIS — Z23 Encounter for immunization: Secondary | ICD-10-CM

## 2019-10-19 NOTE — Progress Notes (Signed)
   Covid-19 Vaccination Clinic  Name:  Manuel Beard    MRN: [id number] DOB: Jun 05, 1946  10/19/2019  Mr. Manuel Beard was observed post Covid-19 immunization for 15 minutes without incident. He was provided with Vaccine Information Sheet and instruction to access the V-Safe system.   Mr. Manuel Beard was instructed to call 911 with any severe reactions post vaccine: Marland Kitchen Difficulty breathing  . Swelling of face and throat  . A fast heartbeat  . A bad rash all over body  . Dizziness and weakness   Immunizations Administered    Name Date Dose VIS Date Route   Pfizer COVID-19 Vaccine 10/19/2019  3:17 PM 0.3 mL 08/24/2018 Intramuscular   Manufacturer: [employer]   Lot: [id number]   [place]: [id number]

## 2021-02-04 ENCOUNTER — Ambulatory Visit (INDEPENDENT_AMBULATORY_CARE_PROVIDER_SITE_OTHER): Payer: Medicare Other | Admitting: Family Medicine

## 2021-02-04 ENCOUNTER — Other Ambulatory Visit: Payer: Self-pay

## 2021-02-04 ENCOUNTER — Ambulatory Visit
Admission: RE | Admit: 2021-02-04 | Discharge: 2021-02-04 | Disposition: A | Discharge status: Home / Self Care | Payer: Medicare Other | Source: Ambulatory Visit | Attending: Family Medicine | Admitting: Family Medicine

## 2021-02-04 ENCOUNTER — Ambulatory Visit
Admission: RE | Admit: 2021-02-04 | Discharge: 2021-02-04 | Disposition: A | Discharge status: Home / Self Care | Payer: Medicare Other | Attending: Family Medicine | Admitting: Family Medicine

## 2021-02-04 ENCOUNTER — Ambulatory Visit: Payer: Self-pay | Admitting: Family Medicine

## 2021-02-04 ENCOUNTER — Encounter: Payer: Self-pay | Admitting: Family Medicine

## 2021-02-04 VITALS — BP 118/72 | HR 75 | Temp 97.4°F | Ht 71.0 in | Wt 252.0 lb

## 2021-02-04 DIAGNOSIS — R202 Paresthesia of skin: Secondary | ICD-10-CM | POA: Diagnosis present

## 2021-02-04 DIAGNOSIS — R252 Cramp and spasm: Secondary | ICD-10-CM | POA: Diagnosis present

## 2021-02-04 MED ORDER — GABAPENTIN 100 MG PO CAPS: ORAL_CAPSULE | ORAL | 11 refills | Status: DC

## 2021-02-04 NOTE — Patient Instructions (Signed)
-   Start gabapentin and adjust per Rx instructions (remain at lowest dose necessary to adequately control symptoms) - Obtain x-rays of the neck and low back with orders provided - Activity as tolerated using symptoms as a guide - Return in 4 weeks, contact for questions

## 2021-02-04 NOTE — Assessment & Plan Note (Signed)
See additional assessment(s) for plan details. 

## 2021-02-04 NOTE — Assessment & Plan Note (Signed)
Chronic issue that is progressively worsened over the past 30 years, involves bilateral upper and lower extremities, intermittent in nature, laterality can be variable as well.  Denies relation to increased activity leading to worsened symptoms, particularly in the lower extremities at that time.  He has trialed various over-the-counter supplements with mixed response.  Physical exam reveals limited cervical range of motion towards the left, diminished reflexes in the upper and lower extremities, sensation and strength otherwise intact bilateral upper and lower extremities, negative Spurling's negative straight leg raise, severe deconditioning/tightness throughout the hip/core.  Concern is for degenerative changes of the cervical and lumbar spine leading to symptomatology, additionally metabolic derangement versus autoimmune causes considered.  I discussed the same with patient as well as evaluation and treatment measures, plan for x-rays of the cervical and lumbar spine, per his request we will hold from serum studies.  I have placed him on gabapentin with instructions on how to titrate this.  He will return for follow-up in 4 weeks.

## 2021-02-04 NOTE — Progress Notes (Signed)
Primary Care / Sports Medicine Office Visit  Patient Information:  Patient ID: Manuel Beard, male DOB: 01/27/1946 Age: 75 y.o. MRN: [id number]   Manuel Beard is a pleasant 75 y.o. male presenting with the following:  Chief Complaint  Patient presents with   leg cramps    X 30 years last 6 months getting worse, both legs, wakes him up every night, leg twitches sometimes    Establish Care   Arm Pain    When working out from the gym     Review of Systems pertinent details above   Patient Active Problem List   Diagnosis Date Noted   Muscle cramping 02/04/2021   Bilateral leg paresthesia 02/04/2021   Hyponatremia 07/13/2019   Need for home health care 07/13/2019   Healthcare-associated pneumonia 07/11/2019   Acute hypoxemic respiratory failure ([place]) 06/26/2019   COVID-19 06/26/2019   Trigger finger, left middle finger 06/24/2016   Primary osteoarthritis of first carpometacarpal joint of left hand 10/02/2015   Epistaxis, recurrent 01/10/2013   Allergic rhinitis 09/20/2012   Chronic maxillary sinusitis 09/20/2012   Deviated nasal septum 09/20/2012   Hypertrophy of nasal turbinates 09/20/2012   Past Medical History:  Diagnosis Date   Arthritis    thumbs   GERD (gastroesophageal reflux disease)    rare   Headache    sinus   Motion sickness    amusement park rides   Outpatient Encounter Medications as of 02/04/2021  Medication Sig   APPLE CIDER VINEGAR PO Take by mouth. gummies   gabapentin (NEURONTIN) 100 MG capsule One tab PO qHS for a week, then BID for a week, then TID.   loratadine (CLARITIN) 10 MG tablet Take 10 mg by mouth daily.   magnesium gluconate (MAGONATE) 500 MG tablet Take 500 mg by mouth in the morning and at bedtime.   niacin 250 MG CR capsule Take 250 mg by mouth at bedtime.   omeprazole (PRILOSEC) 20 MG capsule Take 20 mg by mouth daily as needed.    [DISCONTINUED] Alpha-Lipoic Acid 600 MG TABS Take 600 mg by mouth daily.   [DISCONTINUED] Ca  Carbonate-Mag Hydroxide (ROLAIDS PO) Take by mouth as needed.   [DISCONTINUED] Omega-3 1000 MG CAPS Take 2 g by mouth daily.   No facility-administered encounter medications on file as of 02/04/2021.   Past Surgical History:  Procedure Laterality Date   COLONOSCOPY WITH PROPOFOL N/A 02/09/2018   Procedure: COLONOSCOPY WITH PROPOFOL;  Surgeon: Lollie Sails, MD;  Location: [facility];  Service: Endoscopy;  Laterality: N/A;   FRONTAL SINUS EXPLORATION Bilateral 01/04/2015   Procedure: FRONTAL SINUS EXPLORATION;  Surgeon: Margaretha Sheffield, MD;  Location: [place];  Service: ENT;  Laterality: Bilateral;   IMAGE GUIDED SINUS SURGERY N/A 01/04/2015   Procedure: IMAGE GUIDED SINUS SURGERY;  Surgeon: Margaretha Sheffield, MD;  Location: [place];  Service: ENT;  Laterality: N/A;  GAVE DISK TO CE CE   KNEE SURGERY Right    x1   KNEE SURGERY Left    x2   SEPTOPLASTY WITH ETHMOIDECTOMY, AND MAXILLARY ANTROSTOMY Bilateral 01/04/2015   Procedure: SEPTOPLASTY WITH ETHMOIDECTOMY, AND MAXILLARY ANTROSTOMY;  Surgeon: Margaretha Sheffield, MD;  Location: [place];  Service: ENT;  Laterality: Bilateral;   SPHENOIDECTOMY Bilateral 01/04/2015   Procedure: Coralee Pesa;  Surgeon: Margaretha Sheffield, MD;  Location: [place];  Service: ENT;  Laterality: Bilateral;   TURBINATE REDUCTION Bilateral 01/04/2015   Procedure: INFERIOR TURBINATE REDUCTION ;  Surgeon: Margaretha Sheffield, MD;  Location: [place];  Service: ENT;  Laterality: Bilateral;   WRIST ARTHROSCOPY WITH CARPOMETACARPEL ([place]) ARTHROPLASTY  10/02/2015   reposition interposition arthroplasty.    Vitals:   02/04/21 1440  BP: 118/72  Pulse: 75  Temp: (!) 97.4 F (36.3 C)  SpO2: 97%   Vitals:   02/04/21 1440  Weight: 252 lb (114.3 kg)  Height: '5\' 11"'$  (1.803 m)   Body mass index is 35.15 kg/m.  No results found.   Independent interpretation of notes and tests performed by another provider:   None  Procedures  performed:   None  Pertinent History, Exam, Impression, and Recommendations:   Muscle cramping Chronic issue that is progressively worsened over the past 30 years, involves bilateral upper and lower extremities, intermittent in nature, laterality can be variable as well.  Denies relation to increased activity leading to worsened symptoms, particularly in the lower extremities at that time.  He has trialed various over-the-counter supplements with mixed response.  Physical exam reveals limited cervical range of motion towards the left, diminished reflexes in the upper and lower extremities, sensation and strength otherwise intact bilateral upper and lower extremities, negative Spurling's negative straight leg raise, severe deconditioning/tightness throughout the hip/core.  Concern is for degenerative changes of the cervical and lumbar spine leading to symptomatology, additionally metabolic derangement versus autoimmune causes considered.  I discussed the same with patient as well as evaluation and treatment measures, plan for x-rays of the cervical and lumbar spine, per his request we will hold from serum studies.  I have placed him on gabapentin with instructions on how to titrate this.  He will return for follow-up in 4 weeks.  Bilateral leg paresthesia See additional assessment(s) for plan details.    Orders & Medications Meds ordered this encounter  Medications   gabapentin (NEURONTIN) 100 MG capsule    Sig: One tab PO qHS for a week, then BID for a week, then TID.    Dispense:  90 capsule    Refill:  11   Orders Placed This Encounter  Procedures   DG Cervical Spine Complete   DG Lumbar Spine Complete     Return in about 4 weeks (around 03/04/2021) for follow-up muscle cramps.     Montel Culver, MD   Primary Care Sports Medicine [place]

## 2021-02-05 ENCOUNTER — Encounter: Payer: Self-pay | Admitting: Family Medicine

## 2021-02-05 NOTE — Progress Notes (Signed)
The x-rays of the neck and low back show levels of "wear and tear" degenerative changes that can account for your symptoms in the arms and legs. We will review the images at your return visit but for now, continue the gabapentin and increase weekly until you reach a dose that adequately controls symptoms, continue this until your follow-up and please reach out for questions.

## 2021-03-05 ENCOUNTER — Encounter: Payer: Self-pay | Admitting: Family Medicine

## 2021-03-05 ENCOUNTER — Other Ambulatory Visit: Payer: Self-pay

## 2021-03-05 ENCOUNTER — Ambulatory Visit (INDEPENDENT_AMBULATORY_CARE_PROVIDER_SITE_OTHER): Payer: Medicare Other | Admitting: Family Medicine

## 2021-03-05 VITALS — BP 122/82 | HR 72 | Temp 98.2°F | Ht 71.0 in | Wt 256.0 lb

## 2021-03-05 DIAGNOSIS — R202 Paresthesia of skin: Secondary | ICD-10-CM

## 2021-03-05 DIAGNOSIS — M4712 Other spondylosis with myelopathy, cervical region: Secondary | ICD-10-CM

## 2021-03-05 DIAGNOSIS — M4716 Other spondylosis with myelopathy, lumbar region: Secondary | ICD-10-CM | POA: Diagnosis not present

## 2021-03-05 DIAGNOSIS — R252 Cramp and spasm: Secondary | ICD-10-CM

## 2021-03-05 MED ORDER — DULOXETINE HCL 30 MG PO CPEP: 30.0000 mg | ORAL_CAPSULE | Freq: Every day | ORAL | 3 refills | Status: DC

## 2021-03-05 MED ORDER — GABAPENTIN 100 MG PO CAPS: ORAL_CAPSULE | ORAL | 1 refills | Status: DC

## 2021-03-05 NOTE — Patient Instructions (Addendum)
-   Increase gabapentin to 2 capsules 3 times daily x1 week, can further increase by 1 capsule 3 times daily every week, stop at the lowest dose necessary to adequately control symptoms (cramps, etc.) - Start duloxetine 30 mg nightly, after 2 weeks contact our office via [place] to provide a status update prior to further increase - Start physical therapy with referral provided (contact us with provider of your choosing) - Return for follow-up in 6 weeks

## 2021-03-05 NOTE — Progress Notes (Signed)
Primary Care / Sports Medicine Office Visit  Patient Information:  Patient ID: Manuel Beard, male DOB: July 17, 1945 Age: 75 y.o. MRN: [id number]   Manuel Beard is a pleasant 76 y.o. male presenting with the following:  Chief Complaint  Patient presents with   Follow-up   Muscle Cramping    Bilateral leg spasms and cramping; more severe at night, waking patient up at night; gabapentin ineffective; X-Ray lumbar and cervical spine 02/04/21; 4/10 pain    Review of Systems pertinent details above   Patient Active Problem List   Diagnosis Date Noted   Lumbar spondylosis with myelopathy 03/05/2021   Cervical spondylosis with myelopathy 03/05/2021   Muscle cramping 02/04/2021   Bilateral leg paresthesia 02/04/2021   Hyponatremia 07/13/2019   Need for home health care 07/13/2019   Healthcare-associated pneumonia 07/11/2019   Acute hypoxemic respiratory failure ([place]) 06/26/2019   COVID-19 06/26/2019   Trigger finger, left middle finger 06/24/2016   Primary osteoarthritis of first carpometacarpal joint of left hand 10/02/2015   Epistaxis, recurrent 01/10/2013   Allergic rhinitis 09/20/2012   Chronic maxillary sinusitis 09/20/2012   Deviated nasal septum 09/20/2012   Hypertrophy of nasal turbinates 09/20/2012   Past Medical History:  Diagnosis Date   Arthritis    thumbs   GERD (gastroesophageal reflux disease)    rare   Headache    sinus   Motion sickness    amusement park rides   Outpatient Encounter Medications as of 03/05/2021  Medication Sig   APPLE CIDER VINEGAR PO Take by mouth. gummies   DULoxetine (CYMBALTA) 30 MG capsule Take 1 capsule (30 mg total) by mouth daily.   loratadine (CLARITIN) 10 MG tablet Take 10 mg by mouth daily.   magnesium gluconate (MAGONATE) 500 MG tablet Take 500 mg by mouth in the morning and at bedtime.   omeprazole (PRILOSEC) 20 MG capsule Take 20 mg by mouth daily as needed.    [DISCONTINUED] gabapentin (NEURONTIN) 100 MG capsule One tab PO  qHS for a week, then BID for a week, then TID. (Patient taking differently: Take 100 mg by mouth 3 (three) times daily.)   gabapentin (NEURONTIN) 100 MG capsule 2 tabs TID for a week, then 3 tabs TID for a week, then 4 tabs TID for a week, then 5 tabs TID for a week, then 6 tabs TID for a week   [DISCONTINUED] niacin 250 MG CR capsule Take 250 mg by mouth at bedtime.   No facility-administered encounter medications on file as of 03/05/2021.   Past Surgical History:  Procedure Laterality Date   COLONOSCOPY WITH PROPOFOL N/A 02/09/2018   Procedure: COLONOSCOPY WITH PROPOFOL;  Surgeon: Lollie Sails, MD;  Location: [facility] ENDOSCOPY;  Service: Endoscopy;  Laterality: N/A;   FRONTAL SINUS EXPLORATION Bilateral 01/04/2015   Procedure: FRONTAL SINUS EXPLORATION;  Surgeon: Margaretha Sheffield, MD;  Location: [place];  Service: ENT;  Laterality: Bilateral;   IMAGE GUIDED SINUS SURGERY N/A 01/04/2015   Procedure: IMAGE GUIDED SINUS SURGERY;  Surgeon: Margaretha Sheffield, MD;  Location: [place];  Service: ENT;  Laterality: N/A;  GAVE DISK TO CE CE   KNEE SURGERY Right    x1   KNEE SURGERY Left    x2   SEPTOPLASTY WITH ETHMOIDECTOMY, AND MAXILLARY ANTROSTOMY Bilateral 01/04/2015   Procedure: SEPTOPLASTY WITH ETHMOIDECTOMY, AND MAXILLARY ANTROSTOMY;  Surgeon: Margaretha Sheffield, MD;  Location: [place];  Service: ENT;  Laterality: Bilateral;   SPHENOIDECTOMY Bilateral 01/04/2015   Procedure:  SPHENOIDECTOMY;  Surgeon: Margaretha Sheffield, MD;  Location: [place];  Service: ENT;  Laterality: Bilateral;   TURBINATE REDUCTION Bilateral 01/04/2015   Procedure: INFERIOR TURBINATE REDUCTION ;  Surgeon: Margaretha Sheffield, MD;  Location: [place];  Service: ENT;  Laterality: Bilateral;   WRIST ARTHROSCOPY WITH CARPOMETACARPEL ([place]) ARTHROPLASTY  10/02/2015   reposition interposition arthroplasty.    Vitals:   03/05/21 1423  BP: 122/82  Pulse: 72  Temp: 98.2 F (36.8 C)  SpO2: 95%   Vitals:    03/05/21 1423  Weight: 256 lb (116.1 kg)  Height: '5\' 11"'$  (1.803 m)   Body mass index is 35.7 kg/m.  DG Cervical Spine Complete  Result Date: 02/05/2021 CLINICAL DATA:  Neck and upper extremity pain, initial encounter EXAM: CERVICAL SPINE - COMPLETE 4+ VIEW COMPARISON:  None. FINDINGS: Seven cervical segments are well visualized. Vertebral body height is well maintained. Multilevel osteophytic changes are seen. Mild neural foraminal narrowing is noted at C5-6 and C6-7 on the left and C4-5 and C5-6 on the right. The odontoid is within normal limits. No acute fracture or acute facet abnormality is noted. No soft tissue abnormality is seen. IMPRESSION: Multilevel degenerative change without acute abnormality. Electronically Signed   By: Inez Catalina M.D.   On: 02/05/2021 01:29   DG Lumbar Spine Complete  Result Date: 02/05/2021 CLINICAL DATA:  Low back pain radiating into both legs, initial encounter EXAM: LUMBAR SPINE - COMPLETE 4+ VIEW COMPARISON:  None. FINDINGS: Five lumbar type vertebral bodies are well visualized. Vertebral body height is well maintained. Mild anterolisthesis of a degenerative nature is noted of L4 on L5. Mild osteophytic changes are seen. No pars defects are noted. IMPRESSION: Degenerative change without acute abnormality. Electronically Signed   By: Inez Catalina M.D.   On: 02/05/2021 01:31     Independent interpretation of notes and tests performed by another provider:   Independent interpretation of cervical x-rays dated 02/06/2019 reveal multilevel intervertebral space narrowing, prominent bridging anterior endplate osteophytes and prominent facet arthrosis, loss of cervical lordosis alignment noted, no acute osseous processes identified.  Interval interpretation of lumbar spine x-rays dated 02/06/2019 reveal prominent facet hypertrophy, sclerosis, and asymmetric multilevel degenerative changes with intervertebral space narrowing, no acute osseous processes  identified.  Procedures performed:   None  Pertinent History, Exam, Impression, and Recommendations:   Lumbar spondylosis with myelopathy Patient has noted interval improvement in the severity of his extremity cramping, frequency has maintained the same, in addition to the gabapentin titrating dose, he has backed off from physical activity in the gym.  Denies any new symptoms from previous visit.  Upon review of his cervical and lumbar x-rays, multilevel significant degenerative changes noted which can account for presenting concerns.  I have reviewed the correlation between degeneration of the cervical and lumbar spine and neuromuscular symptoms.  At this stage plan is for formal physical therapy, further titration of gabapentin, and initiation of duloxetine.  A referral was placed for physical therapy today, new dosing guidelines for gabapentin with further titration per dose weekly until adequate symptom response noted.  Lastly, duloxetine 30 mg nightly x2 weeks at which time he is to contact our office for further titration if he is tolerating this medication well.  He is return for clinical reevaluation in 6 weeks.  If suboptimal progress noted despite adherence to the above shared plan, advanced imaging and referral to pain and spine group to be considered.  If adequately improved and symptoms controlled, gradual return to his  normal activities and modification of his medication regimen to commence.  Cervical spondylosis with myelopathy See additional assessment(s) for plan details.    Orders & Medications Meds ordered this encounter  Medications   DULoxetine (CYMBALTA) 30 MG capsule    Sig: Take 1 capsule (30 mg total) by mouth daily.    Dispense:  30 capsule    Refill:  3   gabapentin (NEURONTIN) 100 MG capsule    Sig: 2 tabs TID for a week, then 3 tabs TID for a week, then 4 tabs TID for a week, then 5 tabs TID for a week, then 6 tabs TID for a week    Dispense:  90 capsule     Refill:  1   Orders Placed This Encounter  Procedures   Ambulatory referral to Physical Therapy     Return in about 6 weeks (around 04/16/2021).     Montel Culver, MD   Primary Care Sports Medicine [place]

## 2021-03-05 NOTE — Assessment & Plan Note (Signed)
See additional assessment(s) for plan details. 

## 2021-03-05 NOTE — Assessment & Plan Note (Signed)
Patient has noted interval improvement in the severity of his extremity cramping, frequency has maintained the same, in addition to the gabapentin titrating dose, he has backed off from physical activity in the gym.  Denies any new symptoms from previous visit.  Upon review of his cervical and lumbar x-rays, multilevel significant degenerative changes noted which can account for presenting concerns.  I have reviewed the correlation between degeneration of the cervical and lumbar spine and neuromuscular symptoms.  At this stage plan is for formal physical therapy, further titration of gabapentin, and initiation of duloxetine.  A referral was placed for physical therapy today, new dosing guidelines for gabapentin with further titration per dose weekly until adequate symptom response noted.  Lastly, duloxetine 30 mg nightly x2 weeks at which time he is to contact our office for further titration if he is tolerating this medication well.  He is return for clinical reevaluation in 6 weeks.  If suboptimal progress noted despite adherence to the above shared plan, advanced imaging and referral to pain and spine group to be considered.  If adequately improved and symptoms controlled, gradual return to his normal activities and modification of his medication regimen to commence.

## 2021-03-06 NOTE — Addendum Note (Signed)
Addended by: Forbes Cellar on: 03/06/2021 08:03 AM   Modules accepted: Orders

## 2021-03-06 NOTE — Telephone Encounter (Signed)
See other encounter.

## 2021-03-18 ENCOUNTER — Encounter: Payer: Self-pay | Admitting: Family Medicine

## 2021-03-18 NOTE — Telephone Encounter (Signed)
Please advise for medication management. 

## 2021-04-01 ENCOUNTER — Other Ambulatory Visit: Payer: Self-pay | Admitting: Family Medicine

## 2021-04-01 ENCOUNTER — Encounter: Payer: Self-pay | Admitting: Family Medicine

## 2021-04-01 DIAGNOSIS — R202 Paresthesia of skin: Secondary | ICD-10-CM

## 2021-04-01 DIAGNOSIS — R252 Cramp and spasm: Secondary | ICD-10-CM

## 2021-04-01 DIAGNOSIS — M4716 Other spondylosis with myelopathy, lumbar region: Secondary | ICD-10-CM

## 2021-04-01 DIAGNOSIS — M4712 Other spondylosis with myelopathy, cervical region: Secondary | ICD-10-CM

## 2021-04-01 MED ORDER — GABAPENTIN 100 MG PO CAPS: 400.0000 mg | ORAL_CAPSULE | Freq: Every day | ORAL | 0 refills | Status: DC

## 2021-04-01 NOTE — Telephone Encounter (Signed)
Please review.  KP

## 2021-04-23 ENCOUNTER — Other Ambulatory Visit: Payer: Self-pay

## 2021-04-23 ENCOUNTER — Ambulatory Visit (INDEPENDENT_AMBULATORY_CARE_PROVIDER_SITE_OTHER): Payer: Medicare Other | Admitting: Family Medicine

## 2021-04-23 ENCOUNTER — Encounter: Payer: Self-pay | Admitting: Family Medicine

## 2021-04-23 VITALS — BP 124/84 | HR 58 | Ht 71.0 in | Wt 255.0 lb

## 2021-04-23 DIAGNOSIS — M4716 Other spondylosis with myelopathy, lumbar region: Secondary | ICD-10-CM

## 2021-04-23 DIAGNOSIS — R252 Cramp and spasm: Secondary | ICD-10-CM | POA: Diagnosis not present

## 2021-04-23 DIAGNOSIS — M4712 Other spondylosis with myelopathy, cervical region: Secondary | ICD-10-CM | POA: Diagnosis not present

## 2021-04-23 DIAGNOSIS — R202 Paresthesia of skin: Secondary | ICD-10-CM

## 2021-04-23 MED ORDER — GABAPENTIN 100 MG PO CAPS: 200.0000 mg | ORAL_CAPSULE | Freq: Every day | ORAL | 0 refills | Status: DC

## 2021-04-23 NOTE — Assessment & Plan Note (Signed)
See additional assessment(s) for plan details. 

## 2021-04-23 NOTE — Patient Instructions (Signed)
-   Finish out remaining physical therapy sessions and transition to a fully home-based program - Continue 200 mg gabapentin daily - Return for follow-up in 3 months, contact us for any questions between now and then

## 2021-04-23 NOTE — Assessment & Plan Note (Signed)
Patient has demonstrated excellent interval progress following physical therapy and gabapentin dose titration.  Since his return from his recent travels, he has managed to decrease his 400 mg gabapentin to 200 mg daily and is minimally symptomatic with only symptoms noted during significant strenuous physical activity (hours of yard work).  At this stage I have advised the patient to finish out his remaining formal physical therapy sessions, transition to a fully home-based maintenance rehabilitation program, and continue on his current gabapentin 200 mg daily dosing.  I have written for a total of 3 months of medication, he is to return for follow-up at that time to reevaluate the need of continued gabapentin dosing and possible further decrease.  Patient was advised to contact us over the worsening symptomatology despite adherence to the above.

## 2021-04-23 NOTE — Progress Notes (Signed)
Primary Care / Sports Medicine Office Visit  Patient Information:  Patient ID: Manuel Beard, male DOB: 11-30-1945 Age: 75 y.o. MRN: [id number]   Manuel Beard is a pleasant 75 y.o. male presenting with the following:  Chief Complaint  Patient presents with   Cervical spondylosis with myelopathy    Denies pain in office   Lumbar spondylosis with myelopathy    Denies pain in office   Bilateral leg paresthesia    Denies pain in office; taking gabapentin 200 mg nightly with relief    Review of Systems pertinent details above   Patient Active Problem List   Diagnosis Date Noted   Lumbar spondylosis with myelopathy 03/05/2021   Cervical spondylosis with myelopathy 03/05/2021   Muscle cramping 02/04/2021   Bilateral leg paresthesia 02/04/2021   Hyponatremia 07/13/2019   Need for home health care 07/13/2019   Healthcare-associated pneumonia 07/11/2019   Acute hypoxemic respiratory failure ([place]) 06/26/2019   COVID-19 06/26/2019   Trigger finger, left middle finger 06/24/2016   Primary osteoarthritis of first carpometacarpal joint of left hand 10/02/2015   Epistaxis, recurrent 01/10/2013   Allergic rhinitis 09/20/2012   Chronic maxillary sinusitis 09/20/2012   Deviated nasal septum 09/20/2012   Hypertrophy of nasal turbinates 09/20/2012   Past Medical History:  Diagnosis Date   Arthritis    thumbs   GERD (gastroesophageal reflux disease)    rare   Headache    sinus   Motion sickness    amusement park rides   Outpatient Encounter Medications as of 04/23/2021  Medication Sig   omeprazole (PRILOSEC) 20 MG capsule Take 20 mg by mouth daily as needed.    [DISCONTINUED] gabapentin (NEURONTIN) 100 MG capsule Take 4 capsules (400 mg total) by mouth daily for 23 days. (Patient taking differently: Take 200 mg by mouth daily.)   gabapentin (NEURONTIN) 100 MG capsule Take 2 capsules (200 mg total) by mouth daily.   [DISCONTINUED] APPLE CIDER VINEGAR PO Take by mouth. gummies    [DISCONTINUED] DULoxetine (CYMBALTA) 30 MG capsule Take 1 capsule (30 mg total) by mouth daily.   [DISCONTINUED] loratadine (CLARITIN) 10 MG tablet Take 10 mg by mouth daily.   [DISCONTINUED] magnesium gluconate (MAGONATE) 500 MG tablet Take 500 mg by mouth in the morning and at bedtime.   [DISCONTINUED] omeprazole (PRILOSEC) 20 MG capsule Take 20 mg by mouth daily.   No facility-administered encounter medications on file as of 04/23/2021.   Past Surgical History:  Procedure Laterality Date   COLONOSCOPY WITH PROPOFOL N/A 02/09/2018   Procedure: COLONOSCOPY WITH PROPOFOL;  Surgeon: Lollie Sails, MD;  Location: [facility] END[facility]COPY;  Service: Endoscopy;  Laterality: N/A;   FRONTAL SINUS EXPLORATION Bilateral 01/04/2015   Procedure: FRONTAL SINUS EXPLORATION;  Surgeon: Margaretha Sheffield, MD;  Location: [place];  Service: ENT;  Laterality: Bilateral;   IMAGE GUIDED SINUS SURGERY N/A 01/04/2015   Procedure: IMAGE GUIDED SINUS SURGERY;  Surgeon: Margaretha Sheffield, MD;  Location: [place];  Service: ENT;  Laterality: N/A;  GAVE DISK TO CE CE   KNEE SURGERY Right    x1   KNEE SURGERY Left    x2   SEPTOPLASTY WITH ETHMOIDECTOMY, AND MAXILLARY ANTROSTOMY Bilateral 01/04/2015   Procedure: SEPTOPLASTY WITH ETHMOIDECTOMY, AND MAXILLARY ANTROSTOMY;  Surgeon: Margaretha Sheffield, MD;  Location: [place];  Service: ENT;  Laterality: Bilateral;   SPHENOIDECTOMY Bilateral 01/04/2015   Procedure: Coralee Pesa;  Surgeon: Margaretha Sheffield, MD;  Location: [place];  Service: ENT;  Laterality:  Bilateral;   TURBINATE REDUCTION Bilateral 01/04/2015   Procedure: INFERIOR TURBINATE REDUCTION ;  Surgeon: Margaretha Sheffield, MD;  Location: [place];  Service: ENT;  Laterality: Bilateral;   WRIST ARTHROSCOPY WITH CARPOMETACARPEL ([place]) ARTHROPLASTY  10/02/2015   reposition interposition arthroplasty.    Vitals:   04/23/21 1320  BP: 124/84  Pulse: (!) 58  SpO2: 96%   Vitals:   04/23/21 1320   Weight: 255 lb (115.7 kg)  Height: 5\' 11"  (1.803 m)   Body mass index is 35.57 kg/m.  No results found.   Independent interpretation of notes and tests performed by another provider:   None  Procedures performed:   None  Pertinent History, Exam, Impression, and Recommendations:   Lumbar spondylosis with myelopathy Patient has demonstrated excellent interval progress following physical therapy and gabapentin dose titration.  Since his return from his recent travels, he has managed to decrease his 400 mg gabapentin to 200 mg daily and is minimally symptomatic with only symptoms noted during significant strenuous physical activity (hours of yard work).  At this stage I have advised the patient to finish out his remaining formal physical therapy sessions, transition to a fully home-based maintenance rehabilitation program, and continue on his current gabapentin 200 mg daily dosing.  I have written for a total of 3 months of medication, he is to return for follow-up at that time to reevaluate the need of continued gabapentin dosing and possible further decrease.  Patient was advised to contact us over the worsening symptomatology despite adherence to the above.  Cervical spondylosis with myelopathy See additional assessment(s) for plan details.  Muscle cramping See additional assessment(s) for plan details.  Bilateral leg paresthesia See additional assessment(s) for plan details.   Orders & Medications Meds ordered this encounter  Medications   gabapentin (NEURONTIN) 100 MG capsule    Sig: Take 2 capsules (200 mg total) by mouth daily.    Dispense:  120 capsule    Refill:  0   No orders of the defined types were placed in this encounter.    Return in about 3 months (around 07/24/2021).     Montel Culver, MD   Primary Care Sports Medicine [place]

## 2021-06-04 ENCOUNTER — Ambulatory Visit: Payer: Medicare Other | Admitting: Anesthesiology

## 2021-06-04 ENCOUNTER — Ambulatory Visit
Admission: RE | Admit: 2021-06-04 | Discharge: 2021-06-04 | Disposition: A | Discharge status: Home / Self Care | Payer: Medicare Other | Attending: Gastroenterology | Admitting: Gastroenterology

## 2021-06-04 ENCOUNTER — Encounter
Admission: RE | Disposition: A | Discharge status: Home / Self Care | Payer: Self-pay | Source: Home / Self Care | Attending: Gastroenterology

## 2021-06-04 ENCOUNTER — Encounter: Payer: Self-pay | Admitting: *Deleted

## 2021-06-04 DIAGNOSIS — Z800 Family history of malignant neoplasm of digestive organs: Secondary | ICD-10-CM | POA: Insufficient documentation

## 2021-06-04 DIAGNOSIS — K5730 Diverticulosis of large intestine without perforation or abscess without bleeding: Secondary | ICD-10-CM | POA: Insufficient documentation

## 2021-06-04 DIAGNOSIS — Z86010 Personal history of colonic polyps: Secondary | ICD-10-CM | POA: Diagnosis not present

## 2021-06-04 DIAGNOSIS — Z1211 Encounter for screening for malignant neoplasm of colon: Secondary | ICD-10-CM | POA: Insufficient documentation

## 2021-06-04 DIAGNOSIS — K640 First degree hemorrhoids: Secondary | ICD-10-CM | POA: Insufficient documentation

## 2021-06-04 HISTORY — PX: COLONOSCOPY WITH PROPOFOL: SHX5780

## 2021-06-04 HISTORY — DX: Obstructive sleep apnea (adult) (pediatric): G47.33

## 2021-06-04 HISTORY — DX: Diverticulitis of intestine, part unspecified, without perforation or abscess without bleeding: K57.92

## 2021-06-04 HISTORY — DX: Other complications of anesthesia, initial encounter: T88.59XA

## 2021-06-04 SURGERY — COLONOSCOPY WITH PROPOFOL
Anesthesia: General

## 2021-06-04 MED ORDER — PROPOFOL 500 MG/50ML IV EMUL
INTRAVENOUS | Status: DC | PRN
  Administered 2021-06-04: 175 ug/kg/min via INTRAVENOUS

## 2021-06-04 MED ORDER — PROPOFOL 10 MG/ML IV BOLUS
INTRAVENOUS | Status: DC | PRN
  Administered 2021-06-04: 50 mg via INTRAVENOUS

## 2021-06-04 MED ORDER — SODIUM CHLORIDE 0.9 % IV SOLN
INTRAVENOUS | Status: DC
  Administered 2021-06-04: 1000 mL via INTRAVENOUS

## 2021-06-04 MED ORDER — LIDOCAINE HCL (CARDIAC) PF 100 MG/5ML IV SOSY
PREFILLED_SYRINGE | INTRAVENOUS | Status: DC | PRN
  Administered 2021-06-04: 50 mg via INTRAVENOUS

## 2021-06-04 MED ORDER — PROPOFOL 500 MG/50ML IV EMUL
INTRAVENOUS | Status: AC
  Filled 2021-06-04: qty 50

## 2021-06-04 MED ORDER — PHENYLEPHRINE HCL (PRESSORS) 10 MG/ML IV SOLN
INTRAVENOUS | Status: DC | PRN
  Administered 2021-06-04: 160 ug via INTRAVENOUS

## 2021-06-04 MED ORDER — DEXMEDETOMIDINE (PRECEDEX) IN NS 20 MCG/5ML (4 MCG/ML) IV SYRINGE
PREFILLED_SYRINGE | INTRAVENOUS | Status: DC | PRN
  Administered 2021-06-04: 8 ug via INTRAVENOUS

## 2021-06-04 NOTE — Interval H&P Note (Signed)
History and Physical Interval Note:  06/04/2021 7:37 AM  Manuel Beard  has presented today for surgery, with the diagnosis of H/O Adenomatous Polyps (Z86.010).  The various methods of treatment have been discussed with the patient and family. After consideration of risks, benefits and other options for treatment, the patient has consented to  Procedure(s): COLONOSCOPY WITH PROPOFOL (N/A) as a surgical intervention.  The patient's history has been reviewed, patient examined, no change in status, stable for surgery.  I have reviewed the patient's chart and labs.  Questions were answered to the patient's satisfaction.     Lesly Rubenstein  Ok to proceed with colonoscopy

## 2021-06-04 NOTE — Op Note (Signed)
[facility] Patient Name: Manuel Beard Procedure Date: 06/04/2021 7:27 AM MRN: [id number] Account #: [id number] Date of Birth: 05/18/1946 Admit Type: Outpatient Age: 75 Room: [facility] ENDO ROOM 1 Gender: Male Note Status: Finalized Instrument Name: Jasper Riling [id number] Procedure:             Colonoscopy Indications:           Surveillance: Personal history of adenomatous polyps                         on last colonoscopy 3 years ago, Family history of                         colon cancer in a first-degree relative before age 23                         years Providers:             Andrey Farmer MD, MD Referring MD:          Lynnell Jude (Referring MD) Medicines:             Monitored Anesthesia Care Complications:         No immediate complications. Procedure:             Pre-Anesthesia Assessment:                        - Prior to the procedure, a History and Physical was                         performed, and patient medications and allergies were                         reviewed. The patient is competent. The risks and                         benefits of the procedure and the sedation options and                         risks were discussed with the patient. All questions                         were answered and informed consent was obtained.                         Patient identification and proposed procedure were                         verified by the physician, the nurse, the anesthetist                         and the technician in the endoscopy suite. Mental                         Status Examination: alert and oriented. Airway                         Examination: normal oropharyngeal airway and neck  mobility. Respiratory Examination: clear to                         auscultation. CV Examination: normal. Prophylactic                         Antibiotics: The patient does not require prophylactic                          antibiotics. Prior Anticoagulants: The patient has                         taken no previous anticoagulant or antiplatelet                         agents. ASA Grade Assessment: II - A patient with mild                         systemic disease. After reviewing the risks and                         benefits, the patient was deemed in satisfactory                         condition to undergo the procedure. The anesthesia                         plan was to use monitored anesthesia care (MAC).                         Immediately prior to administration of medications,                         the patient was re-assessed for adequacy to receive                         sedatives. The heart rate, respiratory rate, oxygen                         saturations, blood pressure, adequacy of pulmonary                         ventilation, and response to care were monitored                         throughout the procedure. The physical status of the                         patient was re-assessed after the procedure.                        After obtaining informed consent, the colonoscope was                         passed under direct vision. Throughout the procedure,                         the patient's blood pressure, pulse, and oxygen  saturations were monitored continuously. The                         Colonoscope was introduced through the anus and                         advanced to the the cecum, identified by appendiceal                         orifice and ileocecal valve. The colonoscopy was                         performed without difficulty. The patient tolerated                         the procedure well. The quality of the bowel                         preparation was good. Findings:      The perianal and digital rectal examinations were normal.      Multiple small-mouthed diverticula were found in the sigmoid colon.      Internal hemorrhoids were found during  retroflexion. The hemorrhoids       were Grade I (internal hemorrhoids that do not prolapse).      The exam was otherwise without abnormality on direct and retroflexion       views. Impression:            - Diverticulosis in the sigmoid colon.                        - Internal hemorrhoids.                        - The examination was otherwise normal on direct and                         retroflexion views.                        - No specimens collected. Recommendation:        - Discharge patient to home.                        - Resume previous diet.                        - Continue present medications.                        - Repeat colonoscopy in 5 years for surveillance.                        - Return to referring physician as previously                         scheduled. Procedure Code(s):     --- Professional ---                        W2376, Colorectal cancer screening; colonoscopy on  individual at high risk Diagnosis Code(s):     --- Professional ---                        Z86.010, Personal history of colonic polyps                        K64.0, First degree hemorrhoids                        Z80.0, Family history of malignant neoplasm of                         digestive organs                        K57.30, Diverticulosis of large intestine without                         perforation or abscess without bleeding CPT copyright 2019 American Medical Association. All rights reserved. The codes documented in this report are preliminary and upon coder review may  be revised to meet current compliance requirements. Andrey Farmer MD, MD 06/04/2021 8:02:03 AM Number of Addenda: 0 Note Initiated On: 06/04/2021 7:27 AM Scope Withdrawal Time: 0 hours 7 minutes 49 seconds  Total Procedure Duration: 0 hours 12 minutes 41 seconds  Estimated Blood Loss:  Estimated blood loss: none.      [facility]

## 2021-06-04 NOTE — Anesthesia Procedure Notes (Signed)
Date/Time: 06/04/2021 7:40 AM Performed by: Johnna Acosta, CRNA Pre-anesthesia Checklist: Patient identified, Emergency Drugs available, Suction available, Patient being monitored and Timeout performed Patient Re-evaluated:Patient Re-evaluated prior to induction Oxygen Delivery Method: Nasal cannula Preoxygenation: Pre-oxygenation with 100% oxygen Induction Type: IV induction

## 2021-06-04 NOTE — Anesthesia Preprocedure Evaluation (Addendum)
Anesthesia Evaluation  Patient identified by MRN, date of birth, ID band Patient awake    Reviewed: Allergy & Precautions, NPO status , Patient's Chart, lab work & pertinent test results  History of Anesthesia Complications (+) history of anesthetic complications (awareness with MAC)  Airway Mallampati: III  TM Distance: >3 FB Neck ROM: Full    Dental   Pulmonary sleep apnea and Continuous Positive Airway Pressure Ventilation , former smoker,    Pulmonary exam normal        Cardiovascular negative cardio ROS Normal cardiovascular exam     Neuro/Psych  Headaches,    GI/Hepatic Neg liver ROS, GERD  Controlled,  Endo/Other  negative endocrine ROS  Renal/GU negative Renal ROS     Musculoskeletal  (+) Arthritis ,   Abdominal   Peds  Hematology negative hematology ROS (+)   Anesthesia Other Findings 2016 MAC 3 grade 1 view   Reproductive/Obstetrics                            Anesthesia Physical Anesthesia Plan  ASA: 3  Anesthesia Plan: General   Post-op Pain Management:    Induction:   PONV Risk Score and Plan:   Airway Management Planned: Natural Airway  Additional Equipment:   Intra-op Plan:   Post-operative Plan:   Informed Consent: I have reviewed the patients History and Physical, chart, labs and discussed the procedure including the risks, benefits and alternatives for the proposed anesthesia with the patient or authorized representative who has indicated his/her understanding and acceptance.       Plan Discussed with: CRNA  Anesthesia Plan Comments: (Discussed h/o of awareness, and possibility today)        Anesthesia Quick Evaluation

## 2021-06-04 NOTE — Anesthesia Postprocedure Evaluation (Signed)
Anesthesia Post Note  Patient: Manuel Beard  Procedure(s) Performed: COLONOSCOPY WITH PROPOFOL  Patient location during evaluation: PACU Anesthesia Type: General Level of consciousness: awake and alert Pain management: pain level controlled Vital Signs Assessment: post-procedure vital signs reviewed and stable Respiratory status: spontaneous breathing, nonlabored ventilation, respiratory function stable and patient connected to nasal cannula oxygen Cardiovascular status: blood pressure returned to baseline and stable Postop Assessment: no apparent nausea or vomiting Anesthetic complications: no   No notable events documented.   Last Vitals:  Vitals:   06/04/21 0814 06/04/21 0824  BP: (!) 106/53 115/67  Pulse:    Resp:    Temp:    SpO2:      Last Pain:  Vitals:   06/04/21 0824  TempSrc:   PainSc: 0-No pain                 Josalyn Dettmann Josph Macho

## 2021-06-04 NOTE — Transfer of Care (Signed)
Immediate Anesthesia Transfer of Care Note  Patient: ELYAS VILLAMOR  Procedure(s) Performed: COLONOSCOPY WITH PROPOFOL  Patient Location: PACU  Anesthesia Type:General  Level of Consciousness: sedated  Airway & Oxygen Therapy: Patient Spontanous Breathing  Post-op Assessment: Report given to RN and Post -op Vital signs reviewed and stable  Post vital signs: Reviewed and stable  Last Vitals:  Vitals Value Taken Time  BP 99/64 06/04/21 0804  Temp    Pulse 60 06/04/21 0805  Resp 14 06/04/21 0805  SpO2 94 % 06/04/21 0805  Vitals shown include unvalidated device data.  Last Pain:  Vitals:   06/04/21 0704  TempSrc: Temporal  PainSc: 0-No pain         Complications: No notable events documented.

## 2021-06-04 NOTE — H&P (Signed)
Outpatient short stay form Pre-procedure 06/04/2021  Lesly Rubenstein, MD  Primary Physician: Lynnell Jude, MD  Reason for visit:  Surveillance colonoscopy  History of present illness:   75 y/o gentleman with history of three adenomatous polyps on colonoscopy performed three years ago here for surveillance colonoscopy. Mother had colon cancer at the age of 67. No abdominal surgeries. No blood thinners. History of fistula.    Current Facility-Administered Medications:    0.9 %  sodium chloride infusion, , Intravenous, Continuous, Juventino Pavone, Hilton Cork, MD, Last Rate: 20 mL/hr at 06/04/21 0731, Continued from Pre-op at 06/04/21 0731  Medications Prior to Admission  Medication Sig Dispense Refill Last Dose   gabapentin (NEURONTIN) 100 MG capsule Take 2 capsules (200 mg total) by mouth daily. 120 capsule 0 Past Week   loratadine (CLARITIN) 10 MG tablet Take 10 mg by mouth daily.   Past Week   fish oil-omega-3 fatty acids 1000 MG capsule Take 2 g by mouth daily. (Patient not taking: Reported on 06/04/2021)   Not Taking   niacin 250 MG CR capsule Take 250 mg by mouth at bedtime. (Patient not taking: Reported on 06/04/2021)   Not Taking   omeprazole (PRILOSEC) 20 MG capsule Take 20 mg by mouth daily as needed.       pyridOXINE (VITAMIN B-6) 50 MG tablet Take 50 mg by mouth daily. (Patient not taking: Reported on 06/04/2021)   Not Taking     Allergies  Allergen Reactions   Sulfa Antibiotics Rash   Erythromycin Hives   Iron Supplement [Ferrous Sulfate] Other (See Comments)    Facial blisters   Penicillins     Childhood allergy.     Past Medical History:  Diagnosis Date   Anesthesia complication    awareness with MAC   Arthritis    thumbs   Diverticulitis    GERD (gastroesophageal reflux disease)    rare   Headache    sinus   Motion sickness    amusement park rides   OSA (obstructive sleep apnea)     Review of systems:  Otherwise negative.    Physical Exam  Gen: Alert,  oriented. Appears stated age.  HEENT: PERRLA. Lungs: No respiratory distress CV: RRR Abd: soft, benign, no masses Ext: No edema    Planned procedures: Proceed with colonoscopy. The patient understands the nature of the planned procedure, indications, risks, alternatives and potential complications including but not limited to bleeding, infection, perforation, damage to internal organs and possible oversedation/side effects from anesthesia. The patient agrees and gives consent to proceed.  Please refer to procedure notes for findings, recommendations and patient disposition/instructions.     Lesly Rubenstein, MD [facility]

## 2021-06-05 ENCOUNTER — Encounter: Payer: Self-pay | Admitting: Gastroenterology

## 2021-07-16 ENCOUNTER — Other Ambulatory Visit: Payer: Self-pay | Admitting: Family Medicine

## 2021-07-16 DIAGNOSIS — R252 Cramp and spasm: Secondary | ICD-10-CM

## 2021-07-16 DIAGNOSIS — M4712 Other spondylosis with myelopathy, cervical region: Secondary | ICD-10-CM

## 2021-07-16 DIAGNOSIS — R202 Paresthesia of skin: Secondary | ICD-10-CM

## 2021-07-16 DIAGNOSIS — M4716 Other spondylosis with myelopathy, lumbar region: Secondary | ICD-10-CM

## 2021-07-16 MED ORDER — GABAPENTIN 100 MG PO CAPS: 200.0000 mg | ORAL_CAPSULE | Freq: Every day | ORAL | 0 refills | Status: DC

## 2021-07-16 NOTE — Telephone Encounter (Signed)
Medication Refill - Medication: gabapentin (NEURONTIN) 100 MG capsule Pt stated that he only has three pills left. Until his next appointment on 07/25/21.   Has the patient contacted their pharmacy? No. No, more refills.   (Agent: If no, request that the patient contact the pharmacy for the refill. If patient does not wish to contact the pharmacy document the reason why and proceed with request.)   Preferred Pharmacy (with phone number or street name):   [place], [place] - [address]  [place] [address]  Phone: [phone] Fax: [phone]  Hours: Not open 24 hours    Has the patient been seen for an appointment in the last year OR does the patient have an upcoming appointment? Yes.    Agent: Please be advised that RX refills may take up to 3 business days. We ask that you follow-up with your pharmacy.

## 2021-07-16 NOTE — Telephone Encounter (Signed)
Pt has appt 07/25/21 to review dosing of med.  Requested Prescriptions  Pending Prescriptions Disp Refills   gabapentin (NEURONTIN) 100 MG capsule 60 capsule 0    Sig: Take 2 capsules (200 mg total) by mouth daily.     Neurology: Anticonvulsants - gabapentin Passed - 07/16/2021 10:36 AM      Passed - Valid encounter within last 12 months    Recent Outpatient Visits          2 months ago Bilateral leg paresthesia   [facility] Montel Culver, MD   4 months ago Cervical spondylosis with myelopathy   [facility] Montel Culver, MD   5 months ago Muscle cramping   [facility] Montel Culver, MD      Future Appointments            In 1 week Zigmund Daniel, Earley Abide, MD [facility], [facility]

## 2021-07-25 ENCOUNTER — Other Ambulatory Visit: Payer: Self-pay

## 2021-07-25 ENCOUNTER — Ambulatory Visit (INDEPENDENT_AMBULATORY_CARE_PROVIDER_SITE_OTHER): Payer: Medicare Other | Admitting: Family Medicine

## 2021-07-25 ENCOUNTER — Encounter: Payer: Self-pay | Admitting: Family Medicine

## 2021-07-25 VITALS — BP 118/84 | HR 74 | Ht 71.0 in | Wt 247.0 lb

## 2021-07-25 DIAGNOSIS — M4716 Other spondylosis with myelopathy, lumbar region: Secondary | ICD-10-CM

## 2021-07-25 DIAGNOSIS — R202 Paresthesia of skin: Secondary | ICD-10-CM

## 2021-07-25 DIAGNOSIS — R252 Cramp and spasm: Secondary | ICD-10-CM

## 2021-07-25 DIAGNOSIS — M4712 Other spondylosis with myelopathy, cervical region: Secondary | ICD-10-CM | POA: Diagnosis not present

## 2021-07-25 DIAGNOSIS — S63631A Sprain of interphalangeal joint of left index finger, initial encounter: Secondary | ICD-10-CM

## 2021-07-25 MED ORDER — DICLOFENAC SODIUM 2 % EX SOLN: 0.0800 g | Freq: Two times a day (BID) | CUTANEOUS | 0 refills | Status: DC | PRN

## 2021-07-25 MED ORDER — GABAPENTIN 100 MG PO CAPS: 200.0000 mg | ORAL_CAPSULE | Freq: Every day | ORAL | 0 refills | Status: DC

## 2021-07-25 NOTE — Patient Instructions (Signed)
-   Continue gabapentin 200 mg daily - Start topical anti-inflammatory diclofenac twice daily to the area of finger pain x1 week then as needed - If pain persists without improvement at the 1 week mark, obtain x-rays - Return for follow-up in 3 months

## 2021-07-25 NOTE — Assessment & Plan Note (Signed)
Right-hand-dominant patient presenting with roughly 1 month history of, per patient, atraumatic onset of left finger pain and swelling, cannot recall any specific aggravating episode or onset, no prior history of the same.  Did note redness around the PIP with pain at the same site, no radiation, no paresthesias.  He does have a history of gout but has never experienced this at that site.  He states that pain has gradually been improving with time and relative rest.  Examination today does reveal soft tissue swelling about the left PIP, he has full range of motion with mild pain during active full flexion/making a fist, resisted flexion at the PIP elicits severe pain, he has mild tenderness with a-P palpation of the PIP, no laxity with valgus/varus stressing of the PIP, nontender at the volar MCP, no triggering, sensorimotor otherwise intact.  Patient most likely experiencing symptoms secondary to sprain of the PIP joint of the left index finger, cannot exclude element of underlying osteoarthritis.  Have discussed diclofenac 2% twice daily x1 week, x-rays of the hand if pain persist despite this, we will coordinate follow-up appropriately.  Otherwise, he can gradual return to full activity and follow-up on as-needed basis.

## 2021-07-25 NOTE — Assessment & Plan Note (Addendum)
This is a chronic condition ongoing for years, with radiographic evidence of multilevel degenerative changes throughout the cervical spine.  Degenerative changes manifested as significant upper body muscular spasm pain, and dysfunction.  At the last visit on 04/23/2021, we had discussed titration of gabapentin 400 milligrams to 200 mg daily.  He tolerated this transition well but noted progressive symptoms at his self-directed attempts to further decrease this or discontinue this medication.  Given his excellent control with current medication regimen, medication management performed and we will have the patient dose gabapentin 200 mg daily while he works on home-based rehab that was instructed to him by physical therapy.  I like the patient return in 3 months so we can assess his ongoing symptoms and determine the need for possible titration of gabapentin.  Chronic issue, controlled, medication management

## 2021-07-25 NOTE — Assessment & Plan Note (Addendum)
Patient presents for follow-up to lumbar spondylosis with myelopathy, ongoing for years.  At the last visit on 04/23/2021 we had discussed titration down of gabapentin from 400 mg / day to 200 mg / day.  He has been able to tolerate this wean/titration down.  He did attempt to further wean this to 100 mg daily and has even tried without the medication but has noted severe recurrence of myalgias, spasm, pain, and dysfunction.  He is noting no adverse effects from this current regimen.  As such, we discussed medication management, need for further titration, monitoring, and we will place the patient on gabapentin 200 mg/day that he is to dose consistently.  I have encouraged him to increase physical activity, perform exercises instructed by PT in an effort to optimize his care and possibly allow for further titration down.  He will return in 3 months for reevaluation of these matters.  Chronic condition, stable, medication management

## 2021-07-25 NOTE — Progress Notes (Signed)
Primary Care / Sports Medicine Office Visit  Patient Information:  Patient ID: Manuel Beard, male DOB: [birth year] Age: 76 y.o. MRN: [id number]   Manuel Beard is a pleasant 76 y.o. male presenting with the following:  Chief Complaint  Patient presents with   Bilateral leg paresthesia   Cervical spondylosis with myelopathy   Muscle cramping   Hand Pain    Left index finger swelling and pain    Vitals:   07/25/21 1319  BP: 118/84  Pulse: 74  SpO2: 97%   Vitals:   07/25/21 1319  Weight: 247 lb (112 kg)  Height: 5\' 11"  (1.803 m)   Body mass index is 34.45 kg/m.  No results found.   Independent interpretation of notes and tests performed by another provider:   None  Procedures performed:   None  Pertinent History, Exam, Impression, and Recommendations:   Lumbar spondylosis with myelopathy Patient presents for follow-up to lumbar spondylosis with myelopathy, ongoing for years.  At the last visit on 04/23/2021 we had discussed titration down of gabapentin from 400 mg / day to 200 mg / day.  He has been able to tolerate this wean/titration down.  He did attempt to further wean this to 100 mg daily and has even tried without the medication but has noted severe recurrence of myalgias, spasm, pain, and dysfunction.  He is noting no adverse effects from this current regimen.  As such, we discussed medication management, need for further titration, monitoring, and we will place the patient on gabapentin 200 mg/day that he is to dose consistently.  I have encouraged him to increase physical activity, perform exercises instructed by PT in an effort to optimize his care and possibly allow for further titration down.  He will return in 3 months for reevaluation of these matters.  Chronic condition, stable, medication management  Cervical spondylosis with myelopathy This is a chronic condition ongoing for years, with radiographic evidence of multilevel degenerative changes  throughout the cervical spine.  Degenerative changes manifested as significant upper body muscular spasm pain, and dysfunction.  At the last visit on 04/23/2021, we had discussed titration of gabapentin 400 milligrams to 200 mg daily.  He tolerated this transition well but noted progressive symptoms at his self-directed attempts to further decrease this or discontinue this medication.  Given his excellent control with current medication regimen, medication management performed and we will have the patient dose gabapentin 200 mg daily while he works on home-based rehab that was instructed to him by physical therapy.  I like the patient return in 3 months so we can assess his ongoing symptoms and determine the need for possible titration of gabapentin.  Chronic issue, controlled, medication management  Sprain of interphalangeal joint of left index finger Right-hand-dominant patient presenting with roughly 1 month history of, per patient, atraumatic onset of left finger pain and swelling, cannot recall any specific aggravating episode or onset, no prior history of the same.  Did note redness around the PIP with pain at the same site, no radiation, no paresthesias.  He does have a history of gout but has never experienced this at that site.  He states that pain has gradually been improving with time and relative rest.  Examination today does reveal soft tissue swelling about the left PIP, he has full range of motion with mild pain during active full flexion/making a fist, resisted flexion at the PIP elicits severe pain, he has mild tenderness with a-P palpation of the  PIP, no laxity with valgus/varus stressing of the PIP, nontender at the volar MCP, no triggering, sensorimotor otherwise intact.  Patient most likely experiencing symptoms secondary to sprain of the PIP joint of the left index finger, cannot exclude element of underlying osteoarthritis.  Have discussed diclofenac 2% twice daily x1 week, x-rays  of the hand if pain persist despite this, we will coordinate follow-up appropriately.  Otherwise, he can gradual return to full activity and follow-up on as-needed basis.   Orders & Medications Meds ordered this encounter  Medications   gabapentin (NEURONTIN) 100 MG capsule    Sig: Take 2 capsules (200 mg total) by mouth daily.    Dispense:  180 capsule    Refill:  0   Diclofenac Sodium 2 % SOLN    Sig: Apply 0.08 g topically 2 (two) times daily as needed (pain).    Dispense:  112 g    Refill:  0   No orders of the defined types were placed in this encounter.    Return in about 3 months (around 10/23/2021).     Montel Culver, MD   Primary Care Sports Medicine [place]

## 2021-08-01 ENCOUNTER — Encounter: Payer: Self-pay | Admitting: Family Medicine

## 2021-08-01 NOTE — Telephone Encounter (Signed)
For your information  

## 2021-10-24 ENCOUNTER — Telehealth: Payer: Self-pay | Admitting: Family Medicine

## 2021-10-24 ENCOUNTER — Encounter: Payer: Self-pay | Admitting: Family Medicine

## 2021-10-24 ENCOUNTER — Ambulatory Visit
Admission: RE | Admit: 2021-10-24 | Discharge: 2021-10-24 | Disposition: A | Discharge status: Home / Self Care | Payer: Medicare Other | Source: Ambulatory Visit | Attending: Family Medicine | Admitting: Family Medicine

## 2021-10-24 ENCOUNTER — Ambulatory Visit
Admission: RE | Admit: 2021-10-24 | Discharge: 2021-10-24 | Disposition: A | Discharge status: Home / Self Care | Payer: Medicare Other | Attending: Family Medicine | Admitting: Family Medicine

## 2021-10-24 ENCOUNTER — Ambulatory Visit (INDEPENDENT_AMBULATORY_CARE_PROVIDER_SITE_OTHER): Payer: Medicare Other | Admitting: Family Medicine

## 2021-10-24 VITALS — BP 136/86 | HR 71 | Ht 71.0 in | Wt 253.4 lb

## 2021-10-24 DIAGNOSIS — M4716 Other spondylosis with myelopathy, lumbar region: Secondary | ICD-10-CM

## 2021-10-24 DIAGNOSIS — S63631A Sprain of interphalangeal joint of left index finger, initial encounter: Secondary | ICD-10-CM | POA: Insufficient documentation

## 2021-10-24 DIAGNOSIS — B356 Tinea cruris: Secondary | ICD-10-CM

## 2021-10-24 MED ORDER — MELOXICAM 15 MG PO TABS: 15.0000 mg | ORAL_TABLET | Freq: Every day | ORAL | 0 refills | Status: DC

## 2021-10-24 MED ORDER — CLOTRIMAZOLE-BETAMETHASONE 1-0.05 % EX CREA
1.0000 | TOPICAL_CREAM | Freq: Two times a day (BID) | CUTANEOUS | 0 refills | Status: DC
Start: 2021-10-24 — End: 2022-10-30

## 2021-10-24 NOTE — Patient Instructions (Signed)
-   Obtain x-ray today ?- Start meloxicam once daily x7 days, beyond 7 days dose once daily on as-needed basis for low back/left leg symptoms and/or left finger symptoms ?-We will reach out with x-ray results once available ?- Contact us for questions and follow-up as needed ?

## 2021-10-24 NOTE — Telephone Encounter (Signed)
Pt stated he had an appt today and his prescriptions were sent to [facility] but he requested that they be sent to [facility]  ? ? Medication Refill - Medication: clotrimazole-betamethasone (LOTRISONE) cream, DHEA 50 MG CAPS, gabapentin (NEURONTIN) 100 MG capsule, MAGNESIUM GLYCINATE PO, and meloxicam (MOBIC) 15 MG tablet ? ? ?Has the patient contacted their pharmacy? Yes.   ? ?Preferred Pharmacy (with phone number or street name):  ?[facility], [place] Phone:  [phone]  ?Fax:  [phone]  ?  ? ?Has the patient been seen for an appointment in the last year OR does the patient have an upcoming appointment? Yes.   ? ?Agent: Please be advised that RX refills may take up to 3 business days. We ask that you follow-up with your pharmacy.  ?

## 2021-10-24 NOTE — Telephone Encounter (Signed)
fyi

## 2021-10-24 NOTE — Assessment & Plan Note (Signed)
Patient with persistent symptomatology to the left second PIP, examination shows no laxity though tenderness diffusely, pain with ulnar deviation of the joint, this has persisted despite Voltaren topical usage.  Concern for osteoarthritis, this will be further evaluated with dedicated x-rays of the left second digit, plan for 1 week course of scheduled meloxicam then as needed dosing.  For recalcitrant symptomatology can consider local ultrasound-guided injection if clinically indicated. ?

## 2021-10-24 NOTE — Progress Notes (Signed)
?  ? ?Primary Care / Sports Medicine Office Visit ? ?Patient Information:  ?Patient ID: Manuel Beard, male DOB: [birth year] Age: 76 y.o. MRN: [id number]  ? ?Manuel Beard is a pleasant 76 y.o. male presenting with the following: ? ?Chief Complaint  ?Patient presents with  ? Follow-up  ?  Pt here with follow up of cramps and finger pain, states he is feeling much better.   ? ? ?Vitals:  ? 10/24/21 1437  ?BP: 136/86  ?Pulse: 71  ?SpO2: 97%  ? ?Vitals:  ? 10/24/21 1437  ?Weight: 253 lb 6.4 oz (114.9 kg)  ?Height: '5\' 11"'$  (1.803 m)  ? ?Body mass index is 35.34 kg/m?. ? ?No results found.  ? ?Independent interpretation of notes and tests performed by another provider:  ? ?None ? ?Procedures performed:  ? ?None ? ?Pertinent History, Exam, Impression, and Recommendations:  ? ?Problem List Items Addressed This Visit   ? ?  ? Nervous and Auditory  ? Lumbar spondylosis with myelopathy  ?  Patient's symptoms are overall well controlled for this chronic issue on current medication regimen.  That being said as he has been conducting his home-based exercises he has noted burning intermittent symptoms at his left lower leg, Achilles region traversing to the left foot.  Examination reveals no focal tenderness at this region, negative Homans, Thompson test, single heel raise repetitively with benign findings, he has positive straight leg raise (modified) which recreates his stated symptomatology.  As such, his left lower extremity symptoms can be considered secondary to radicular features from the lumbar spine.  I have advised him to refrain from aggressive home-based exercises, use body symptoms as a guide, short course of meloxicam prescribed. ? ?Chronic condition, acute exacerbation, Rx management ? ?  ?  ? Relevant Medications  ? meloxicam (MOBIC) 15 MG tablet  ?  ? Musculoskeletal and Integument  ? Sprain of interphalangeal joint of left index finger  ?  Patient with persistent symptomatology to the left second PIP, examination  shows no laxity though tenderness diffusely, pain with ulnar deviation of the joint, this has persisted despite Voltaren topical usage.  Concern for osteoarthritis, this will be further evaluated with dedicated x-rays of the left second digit, plan for 1 week course of scheduled meloxicam then as needed dosing.  For recalcitrant symptomatology can consider local ultrasound-guided injection if clinically indicated. ? ?  ?  ? Relevant Medications  ? meloxicam (MOBIC) 15 MG tablet  ? Other Relevant Orders  ? DG Finger Index Left  ? Tinea cruris - Primary  ?  Acute issue first patient has trialed OTC medications without relief, significant pruritus, no urinary symptoms, plan for Rx Lotrisone, for recalcitrant symptomatology he was advised to follow-up with his PCP Dr. Lavera Guise. ? ?  ?  ? Relevant Medications  ? clotrimazole-betamethasone (LOTRISONE) cream  ?  ? ?Orders & Medications ?Meds ordered this encounter  ?Medications  ? meloxicam (MOBIC) 15 MG tablet  ?  Sig: Take 1 tablet (15 mg total) by mouth daily.  ?  Dispense:  30 tablet  ?  Refill:  0  ? clotrimazole-betamethasone (LOTRISONE) cream  ?  Sig: Apply 1 application. topically 2 (two) times daily.  ?  Dispense:  45 g  ?  Refill:  0  ? ?Orders Placed This Encounter  ?Procedures  ? DG Finger Index Left  ?  ? ?No follow-ups on file.  ?  ? ?Montel Culver, MD ? ? Primary Care Sports Medicine ?  [facility] ?[place]  ? ?

## 2021-10-24 NOTE — Assessment & Plan Note (Signed)
Acute issue first patient has trialed OTC medications without relief, significant pruritus, no urinary symptoms, plan for Rx Lotrisone, for recalcitrant symptomatology he was advised to follow-up with his PCP Dr. Lavera Guise. ?

## 2021-10-24 NOTE — Assessment & Plan Note (Signed)
Patient's symptoms are overall well controlled for this chronic issue on current medication regimen.  That being said as he has been conducting his home-based exercises he has noted burning intermittent symptoms at his left lower leg, Achilles region traversing to the left foot.  Examination reveals no focal tenderness at this region, negative Homans, Thompson test, single heel raise repetitively with benign findings, he has positive straight leg raise (modified) which recreates his stated symptomatology.  As such, his left lower extremity symptoms can be considered secondary to radicular features from the lumbar spine.  I have advised him to refrain from aggressive home-based exercises, use body symptoms as a guide, short course of meloxicam prescribed. ? ?Chronic condition, acute exacerbation, Rx management ?

## 2021-10-25 ENCOUNTER — Other Ambulatory Visit: Payer: Self-pay

## 2021-10-25 DIAGNOSIS — B356 Tinea cruris: Secondary | ICD-10-CM

## 2021-10-25 NOTE — Telephone Encounter (Signed)
Per MyChart message 10/24/21 encounter, patient had these meds sent to [facility] already. ?

## 2021-11-04 ENCOUNTER — Other Ambulatory Visit: Payer: Self-pay | Admitting: Family Medicine

## 2021-11-04 ENCOUNTER — Other Ambulatory Visit: Payer: Self-pay

## 2021-11-04 DIAGNOSIS — M4712 Other spondylosis with myelopathy, cervical region: Secondary | ICD-10-CM

## 2021-11-04 DIAGNOSIS — M4716 Other spondylosis with myelopathy, lumbar region: Secondary | ICD-10-CM

## 2021-11-04 DIAGNOSIS — R202 Paresthesia of skin: Secondary | ICD-10-CM

## 2021-11-04 DIAGNOSIS — R252 Cramp and spasm: Secondary | ICD-10-CM

## 2021-11-04 MED ORDER — GABAPENTIN 100 MG PO CAPS: 200.0000 mg | ORAL_CAPSULE | Freq: Every day | ORAL | 0 refills | Status: DC

## 2021-11-04 NOTE — Telephone Encounter (Signed)
Ok to refill 

## 2021-11-27 ENCOUNTER — Encounter: Payer: Self-pay | Admitting: Family Medicine

## 2021-11-27 NOTE — Telephone Encounter (Signed)
Please review.  KP

## 2021-11-28 NOTE — Telephone Encounter (Signed)
Please review.  KP

## 2021-12-25 NOTE — Telephone Encounter (Signed)
fyi

## 2021-12-31 IMAGING — CR DG CERVICAL SPINE COMPLETE 4+V
7 series · 7 of 7 positions shown · non-contrast
Comparison: None.

CLINICAL DATA: Neck and upper extremity pain, initial encounter

EXAM:
CERVICAL SPINE - COMPLETE 4+ VIEW

[c-spine lat]
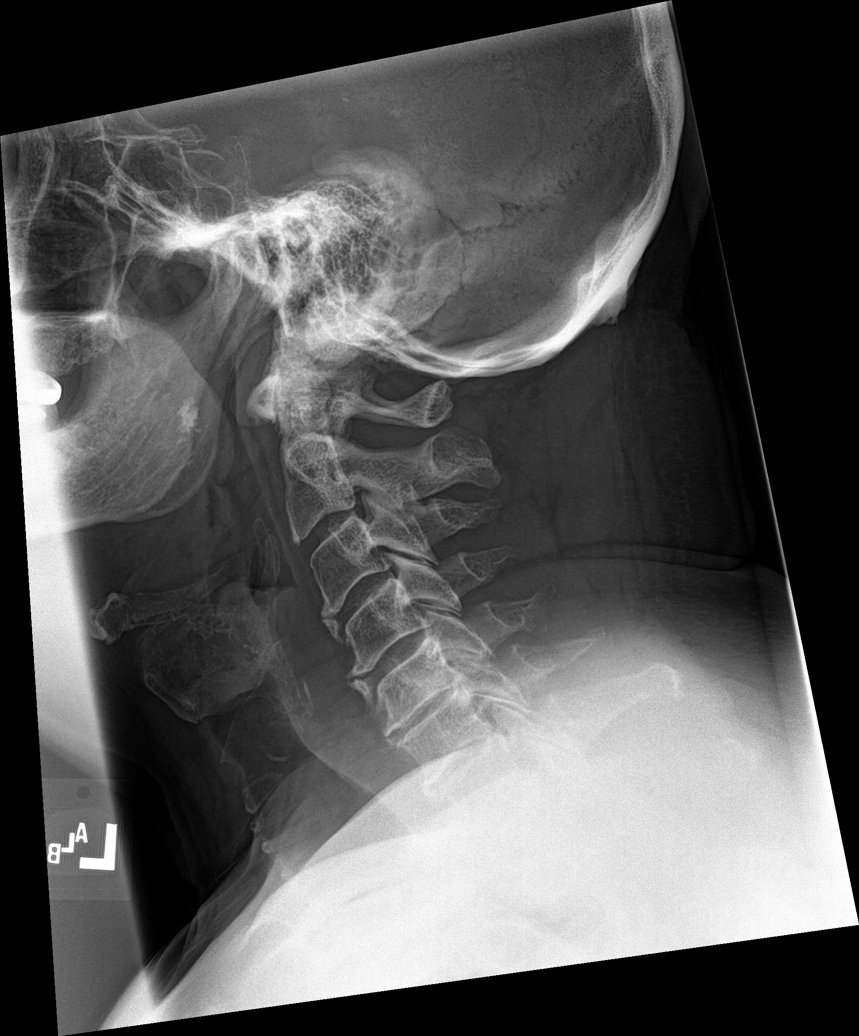

[c-spine obl (1 of 2)]
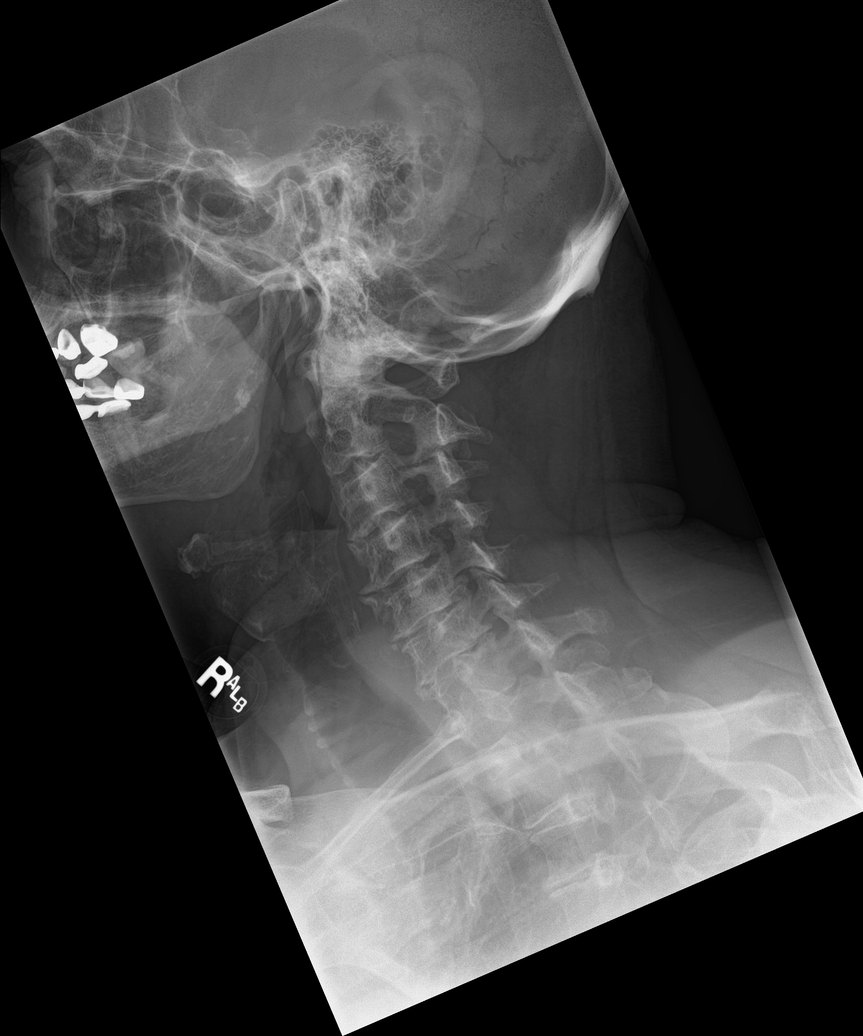

[c-spine obl (2 of 2)]
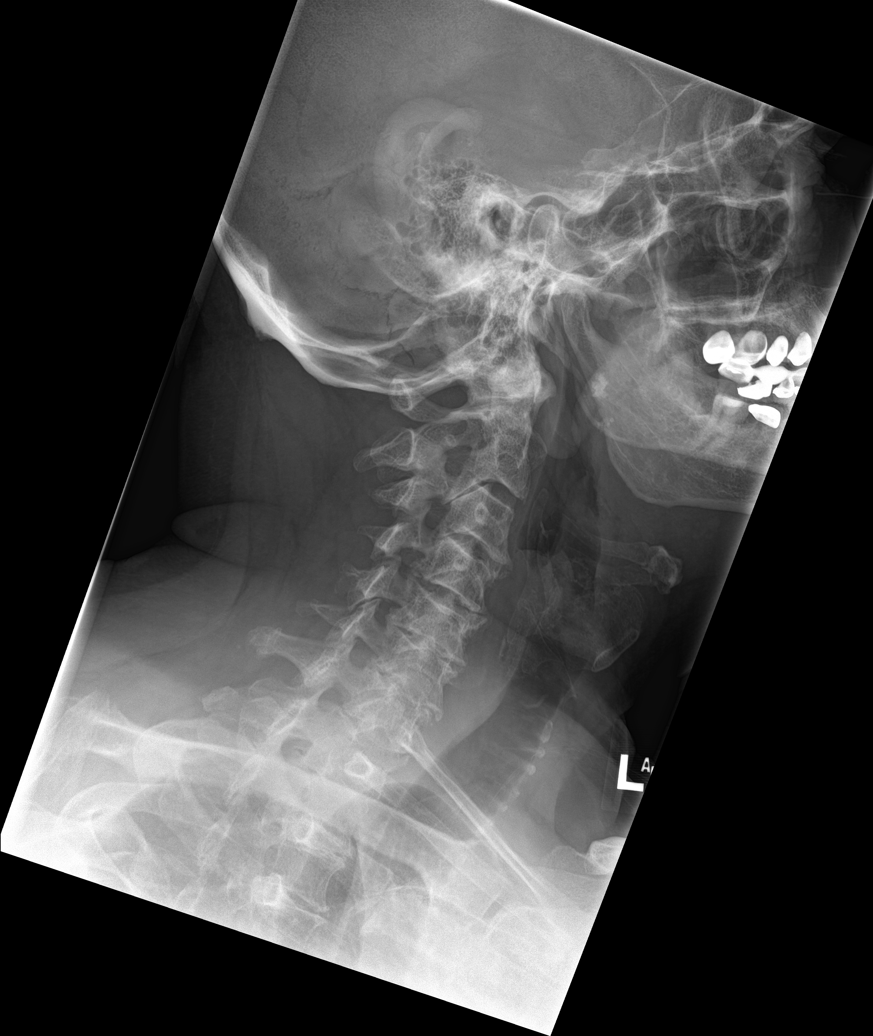

[c-spine ap]
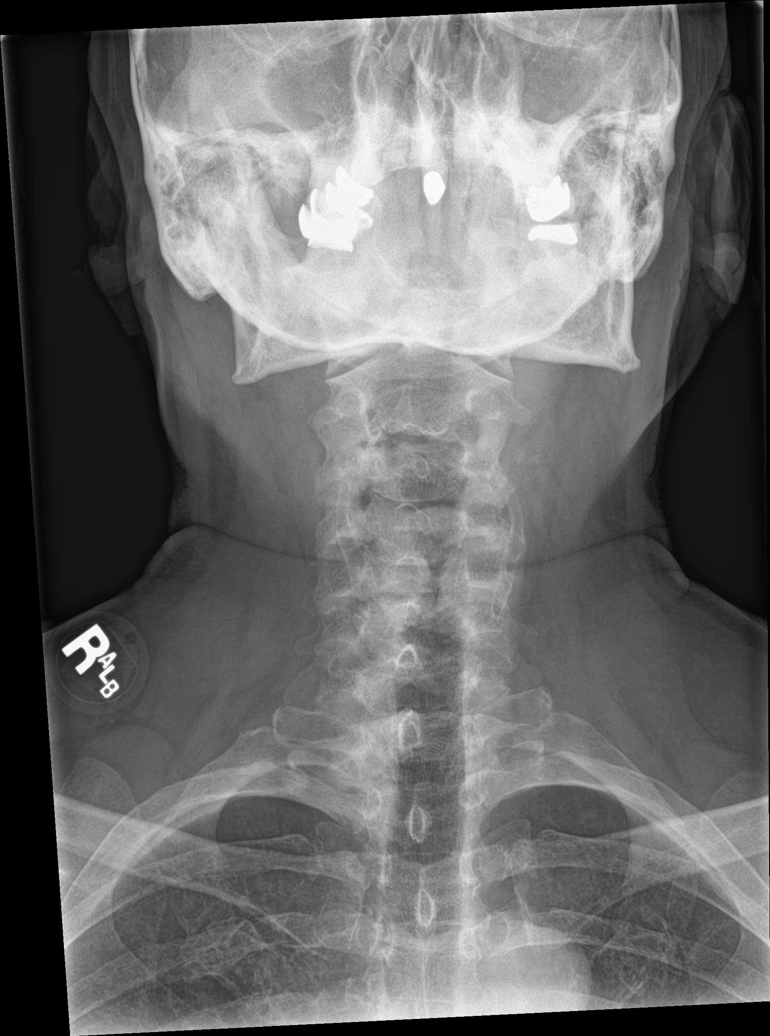

[c-spine open mouth]
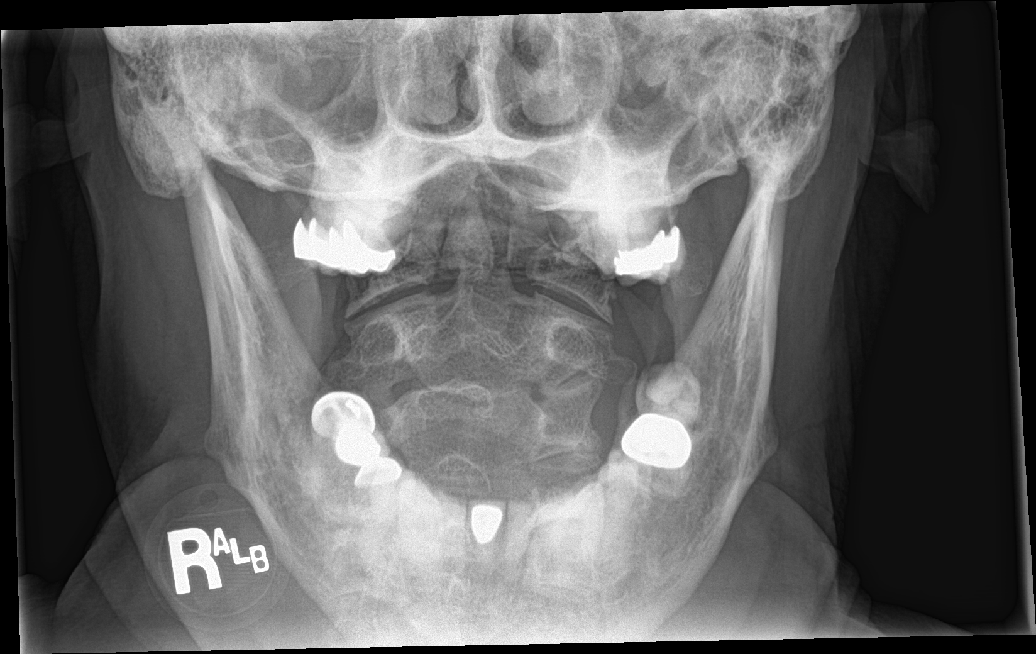

[[person_name]]
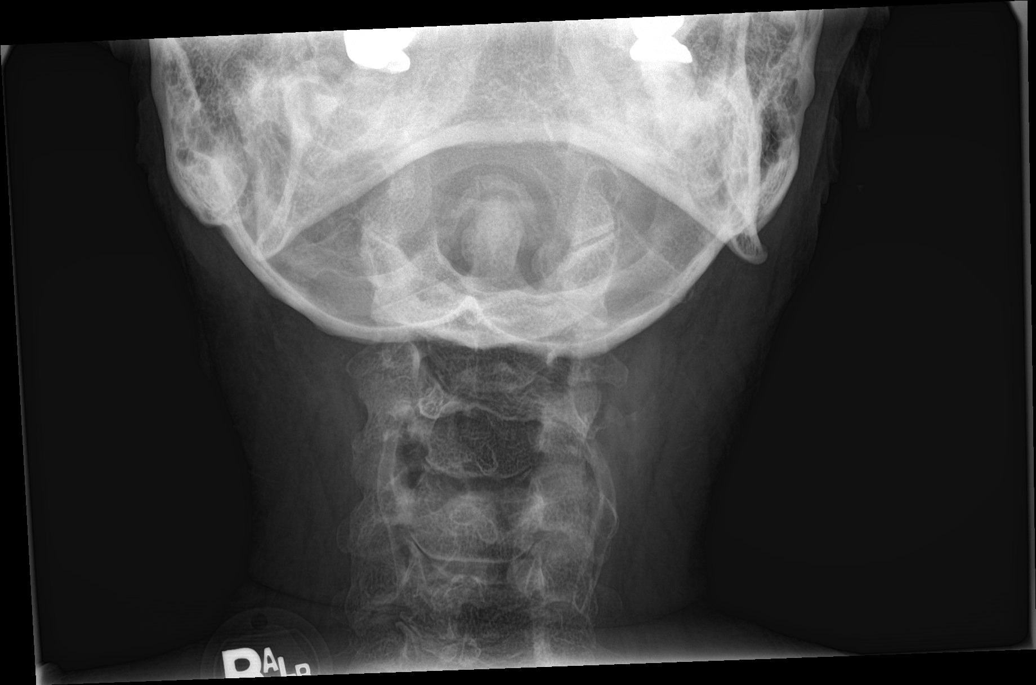

[ct-spine swimmers]
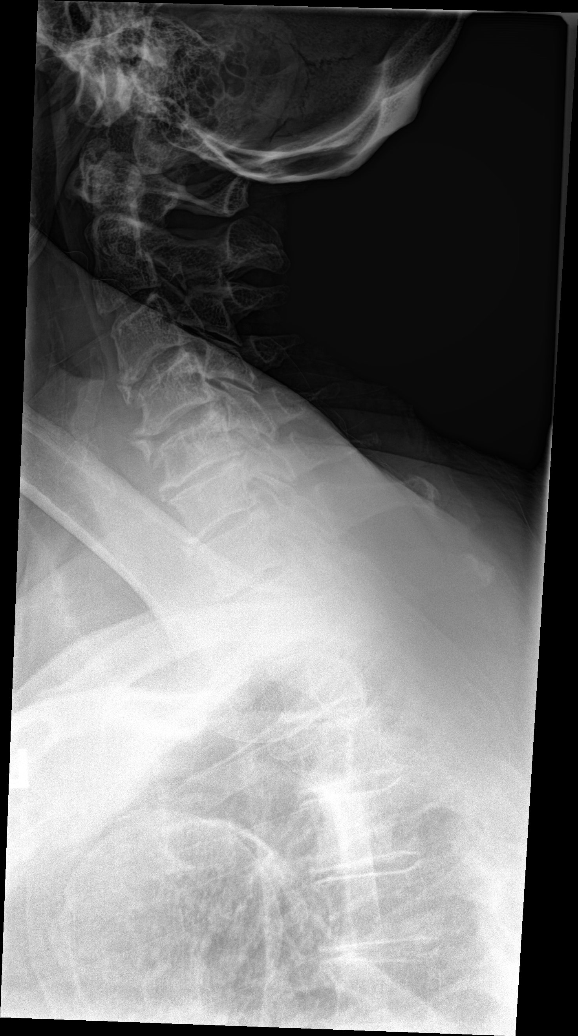

[7 of 7 positions shown; findings below may reference images not displayed]

FINDINGS: Seven cervical segments are well visualized. Vertebral body height
is well maintained. Multilevel osteophytic changes are seen. Mild
neural foraminal narrowing is noted at C5-6 and C6-7 on the left and
C4-5 and C5-6 on the right. The odontoid is within normal limits. No
acute fracture or acute facet abnormality is noted. No soft tissue
abnormality is seen.
IMPRESSION: Multilevel degenerative change without acute abnormality.

## 2022-01-25 ENCOUNTER — Other Ambulatory Visit: Payer: Self-pay | Admitting: Family Medicine

## 2022-01-25 DIAGNOSIS — M4716 Other spondylosis with myelopathy, lumbar region: Secondary | ICD-10-CM

## 2022-01-25 DIAGNOSIS — M4712 Other spondylosis with myelopathy, cervical region: Secondary | ICD-10-CM

## 2022-01-25 DIAGNOSIS — R252 Cramp and spasm: Secondary | ICD-10-CM

## 2022-01-25 DIAGNOSIS — R202 Paresthesia of skin: Secondary | ICD-10-CM

## 2022-01-27 ENCOUNTER — Other Ambulatory Visit: Payer: Self-pay | Admitting: Family Medicine

## 2022-01-27 DIAGNOSIS — R202 Paresthesia of skin: Secondary | ICD-10-CM

## 2022-01-27 DIAGNOSIS — M4716 Other spondylosis with myelopathy, lumbar region: Secondary | ICD-10-CM

## 2022-01-27 DIAGNOSIS — M4712 Other spondylosis with myelopathy, cervical region: Secondary | ICD-10-CM

## 2022-01-27 DIAGNOSIS — R252 Cramp and spasm: Secondary | ICD-10-CM

## 2022-01-27 MED ORDER — GABAPENTIN 100 MG PO CAPS: 100.0000 mg | ORAL_CAPSULE | Freq: Every day | ORAL | 0 refills | Status: DC

## 2022-01-27 NOTE — Telephone Encounter (Signed)
Attempted to call pt x2 I get fast busy signal. Will follow up tomorrow

## 2022-01-27 NOTE — Telephone Encounter (Signed)
Pt states he is tasking 1x'100mg'$  at night.

## 2022-01-27 NOTE — Telephone Encounter (Signed)
Please advise 

## 2022-01-27 NOTE — Telephone Encounter (Signed)
duplicate

## 2022-01-28 NOTE — Telephone Encounter (Signed)
Requested medication (s) are due for refill today: no  Requested medication (s) are on the active medication list:yes  Last refill:  01/27/22  Future visit scheduled: no  Notes to clinic:  Unable to refill per protocol, last refill by provider 01/27/22 for 90 days, possible duplicate.     Requested Prescriptions  Pending Prescriptions Disp Refills   gabapentin (NEURONTIN) 100 MG capsule [Pharmacy Med Name: Gabapentin 100 MG Oral Capsule] 180 capsule 0    Sig: Take 2 capsules by mouth once daily     Neurology: Anticonvulsants - gabapentin Failed - 01/27/2022  1:37 PM      Failed - Cr in normal range and within 360 days    Creatinine, Ser  Date Value Ref Range Status  02/12/2012 1.11 0.50 - 1.35 mg/dL Final         Passed - Completed PHQ-2 or PHQ-9 in the last 360 days      Passed - Valid encounter within last 12 months    Recent Outpatient Visits           3 months ago Tinea cruris   [facility] Montel Culver, MD   6 months ago Lumbar spondylosis with myelopathy   [facility] Montel Culver, MD   9 months ago Bilateral leg paresthesia   [facility] Montel Culver, MD   10 months ago Cervical spondylosis with myelopathy   [facility] Montel Culver, MD   11 months ago Muscle cramping   [facility] Montel Culver, MD

## 2022-03-10 ENCOUNTER — Encounter: Payer: Self-pay | Admitting: Family Medicine

## 2022-04-28 ENCOUNTER — Other Ambulatory Visit: Payer: Self-pay | Admitting: Family Medicine

## 2022-04-28 DIAGNOSIS — M4716 Other spondylosis with myelopathy, lumbar region: Secondary | ICD-10-CM

## 2022-04-28 DIAGNOSIS — M4712 Other spondylosis with myelopathy, cervical region: Secondary | ICD-10-CM

## 2022-04-28 DIAGNOSIS — R202 Paresthesia of skin: Secondary | ICD-10-CM

## 2022-04-28 DIAGNOSIS — R252 Cramp and spasm: Secondary | ICD-10-CM

## 2022-04-28 MED ORDER — GABAPENTIN 100 MG PO CAPS: 100.0000 mg | ORAL_CAPSULE | Freq: Every day | ORAL | 0 refills | Status: DC

## 2022-04-28 NOTE — Telephone Encounter (Signed)
Please advise 

## 2022-06-11 ENCOUNTER — Encounter: Payer: Self-pay | Admitting: Family Medicine

## 2022-06-11 NOTE — Telephone Encounter (Signed)
Please advise 

## 2022-06-12 ENCOUNTER — Other Ambulatory Visit: Payer: Self-pay

## 2022-06-12 DIAGNOSIS — R252 Cramp and spasm: Secondary | ICD-10-CM

## 2022-06-12 DIAGNOSIS — R202 Paresthesia of skin: Secondary | ICD-10-CM

## 2022-06-12 DIAGNOSIS — M4712 Other spondylosis with myelopathy, cervical region: Secondary | ICD-10-CM

## 2022-06-12 DIAGNOSIS — M4716 Other spondylosis with myelopathy, lumbar region: Secondary | ICD-10-CM

## 2022-06-12 MED ORDER — GABAPENTIN 100 MG PO CAPS: 100.0000 mg | ORAL_CAPSULE | Freq: Two times a day (BID) | ORAL | 0 refills | Status: DC

## 2022-06-12 NOTE — Telephone Encounter (Signed)
Med sent.

## 2022-09-08 ENCOUNTER — Telehealth: Payer: Self-pay | Admitting: Family Medicine

## 2022-09-08 ENCOUNTER — Other Ambulatory Visit: Payer: Self-pay

## 2022-09-08 ENCOUNTER — Other Ambulatory Visit: Payer: Self-pay | Admitting: Family Medicine

## 2022-09-08 ENCOUNTER — Encounter: Payer: Self-pay | Admitting: Family Medicine

## 2022-09-08 ENCOUNTER — Telehealth: Payer: Self-pay | Admitting: Internal Medicine

## 2022-09-08 DIAGNOSIS — R252 Cramp and spasm: Secondary | ICD-10-CM

## 2022-09-08 DIAGNOSIS — M4712 Other spondylosis with myelopathy, cervical region: Secondary | ICD-10-CM

## 2022-09-08 DIAGNOSIS — R202 Paresthesia of skin: Secondary | ICD-10-CM

## 2022-09-08 DIAGNOSIS — M4716 Other spondylosis with myelopathy, lumbar region: Secondary | ICD-10-CM

## 2022-09-08 MED ORDER — GABAPENTIN 100 MG PO CAPS: 100.0000 mg | ORAL_CAPSULE | Freq: Two times a day (BID) | ORAL | 0 refills | Status: DC

## 2022-09-08 NOTE — Telephone Encounter (Signed)
Copied from [place] [id number]. Topic: General - Other >> Sep 08, 2022  3:28 PM Santiya F wrote: Reason for CRM: Pt called in requesting to speak with Autumn Patty regarding having to make an appointment to get his medication refilled. Pt says he is not interested in scheduling an appointment because he doesn't think it's necessary.

## 2022-09-08 NOTE — Telephone Encounter (Signed)
Noted  KP 

## 2022-09-08 NOTE — Telephone Encounter (Signed)
Already sent notification. This is an error

## 2022-09-09 NOTE — Telephone Encounter (Signed)
Please advise, pt was asked to make an appointment

## 2022-09-19 IMAGING — CR DG FINGER INDEX 2+V*L*
3 series · 3 of 3 positions shown · non-contrast
Comparison: None.

CLINICAL DATA: Chronic index finger PIP pain and swelling. Evaluate
for osteoarthritis.

EXAM:
LEFT INDEX FINGER 2+V

[finger ap]
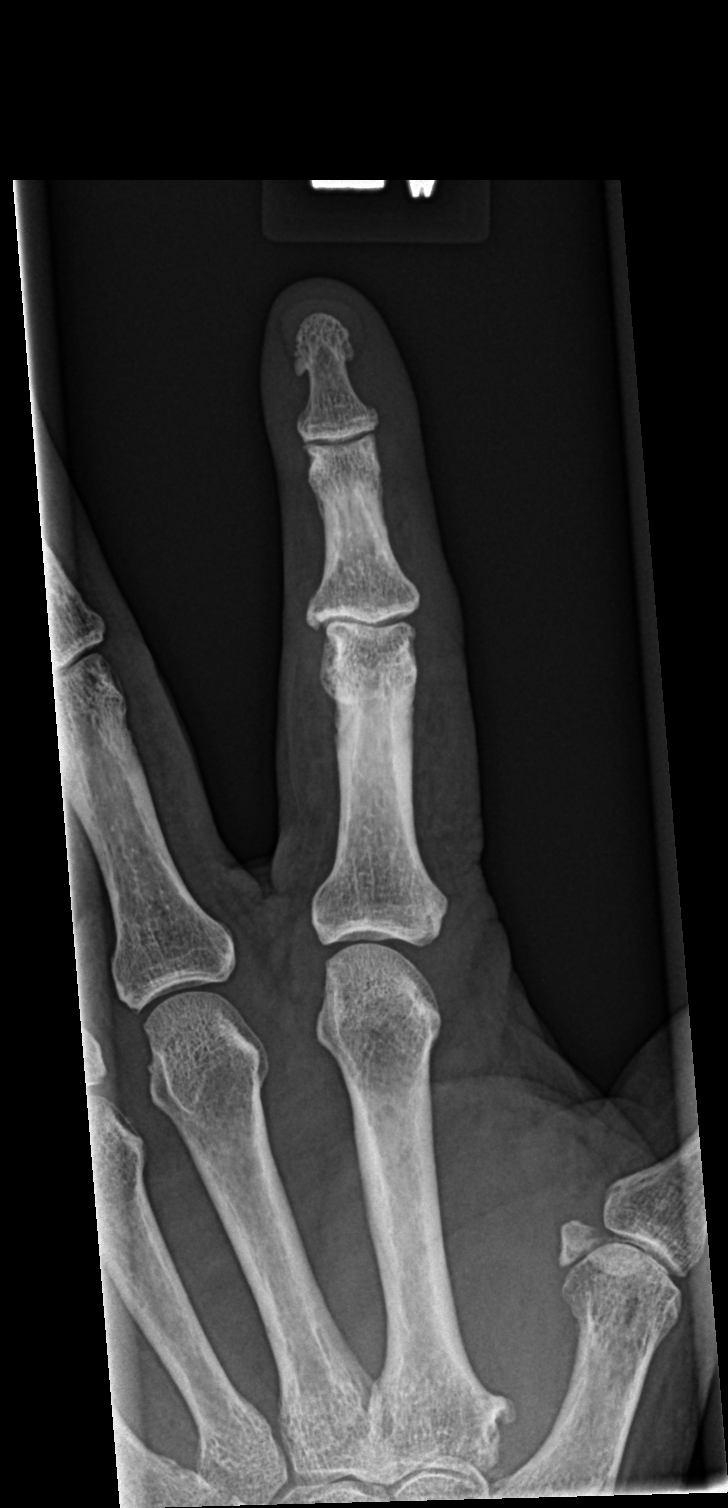

[finger obl]
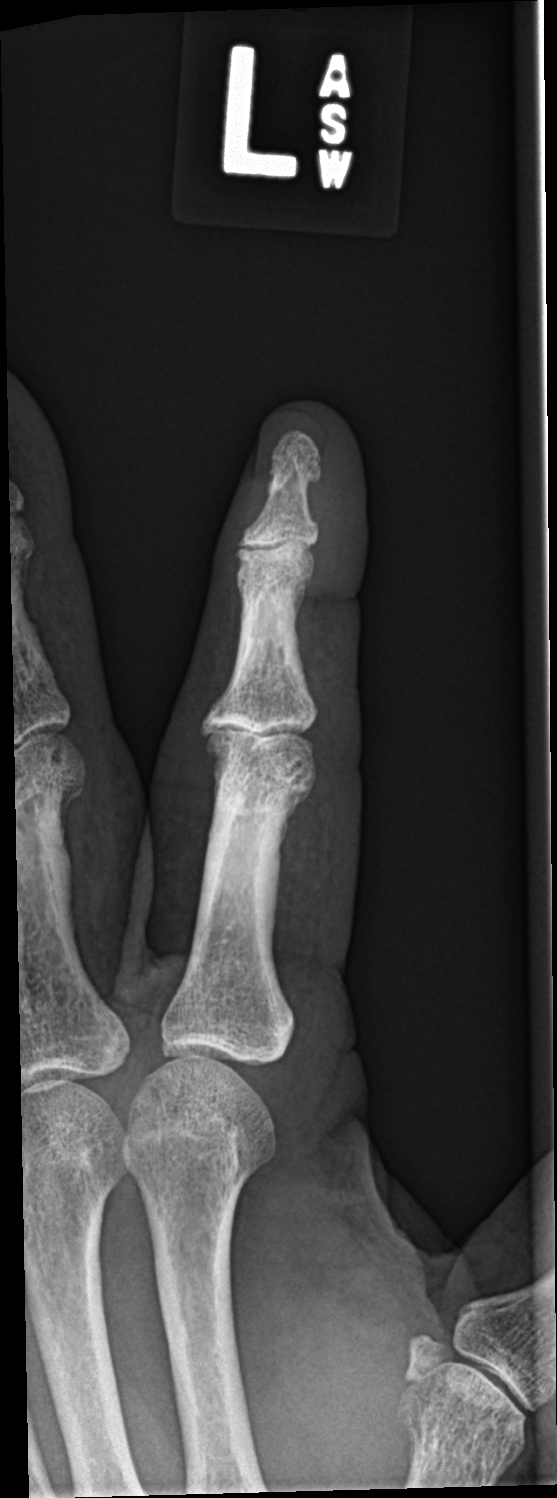

[finger lat]
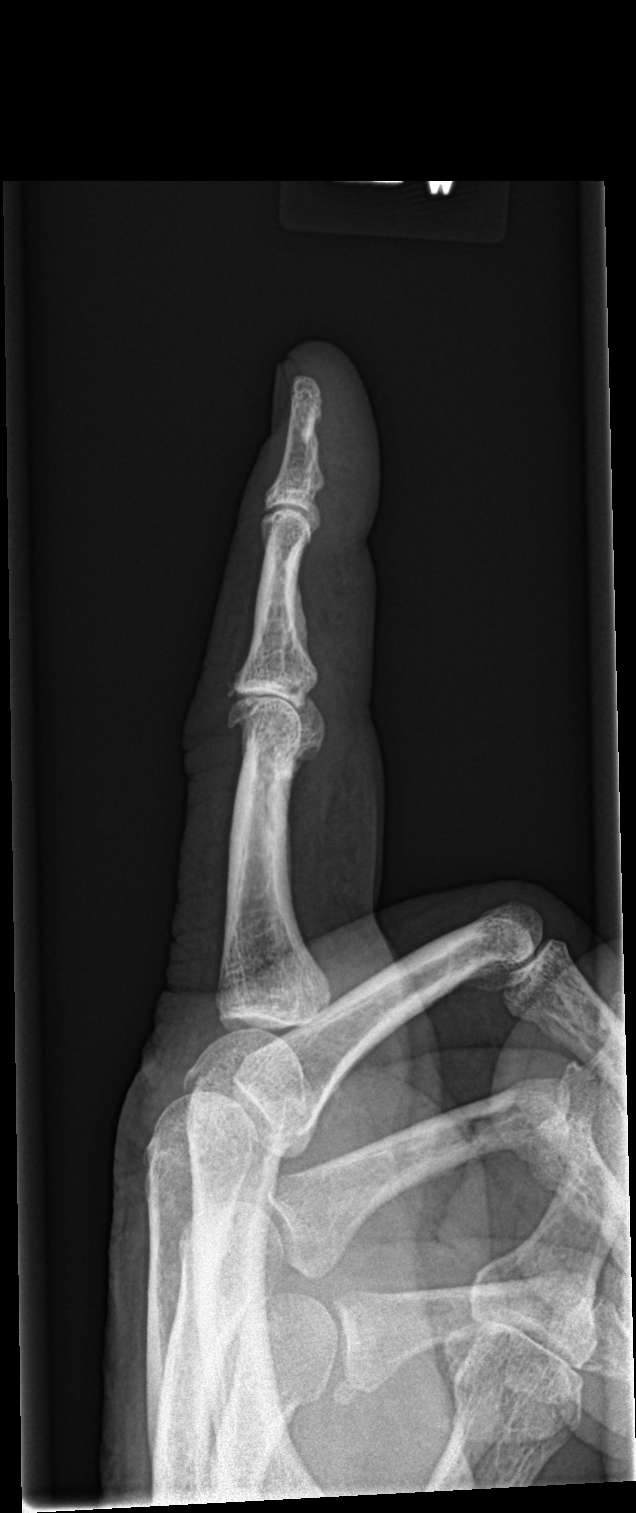

[3 of 3 positions shown; findings below may reference images not displayed]

FINDINGS: Normal bone mineralization. Moderate PIP and mild DIP joint space
narrowing and peripheral osteophytosis. Tiny 1 mm ossicle at the
dorsal base of the middle phalanx of the index finger, age
indeterminate. Otherwise, no acute fracture is seen. No dislocation.

Mild degenerative spurring at the lateral base of the index finger
metacarpal.
IMPRESSION: Moderate PIP and mild DIP index finger osteoarthritis.

Tiny ossicle at the dorsal base of the middle phalanx of the index
finger may reflect an age indeterminate tiny avulsion injury at the
extensor tendon insertion.

## 2022-10-21 ENCOUNTER — Telehealth: Payer: Self-pay | Admitting: Internal Medicine

## 2022-10-21 NOTE — Telephone Encounter (Signed)
Called patient to schedule Medicare Annual Wellness Visit (AWV). No voicemail available to leave a message.  Last date of AWV: NONE  Please schedule an appointment at any time with Lorrie Barnes, LPN .  If any questions, please contact me.  Thank you ,  Carma Dwiggins Harris-Coley; Care Guide Ambulatory Clinical Support [facility] Direct Dial: [phone]   

## 2022-10-28 ENCOUNTER — Ambulatory Visit: Payer: Medicare Other | Admitting: Family Medicine

## 2022-10-29 ENCOUNTER — Ambulatory Visit: Payer: Medicare Other | Admitting: Internal Medicine

## 2022-10-30 ENCOUNTER — Encounter: Payer: Self-pay | Admitting: Family Medicine

## 2022-10-30 ENCOUNTER — Ambulatory Visit (INDEPENDENT_AMBULATORY_CARE_PROVIDER_SITE_OTHER): Payer: Medicare Other | Admitting: Family Medicine

## 2022-10-30 VITALS — BP 140/90 | HR 74 | Ht 71.0 in | Wt 249.0 lb

## 2022-10-30 DIAGNOSIS — M4712 Other spondylosis with myelopathy, cervical region: Secondary | ICD-10-CM

## 2022-10-30 DIAGNOSIS — M4716 Other spondylosis with myelopathy, lumbar region: Secondary | ICD-10-CM | POA: Diagnosis not present

## 2022-10-30 MED ORDER — GABAPENTIN 100 MG PO CAPS: 100.0000 mg | ORAL_CAPSULE | Freq: Two times a day (BID) | ORAL | 0 refills | Status: AC

## 2022-10-30 NOTE — Assessment & Plan Note (Addendum)
Chronic with stable symptoms overall well-controlled with gabapentin 100 mg twice daily, additionally taking several over-the-counter supplements for muscle spasms.  We discussed the role of his underlying spine degenerative changes and associated symptoms given response to gabapentin.  Plan as follows: - Continue gabapentin 100 mg twice daily - Trial of OTC Nervive (ALA, B complex, ginger, turmeric), if beneficial he can this up OTC or purchase individual ingredients - Patient has option to return in 3 months for continued gabapentin monitoring, adjustment, and dosing OR he can obtain this through his PCP at their discretion

## 2022-10-30 NOTE — Assessment & Plan Note (Signed)
See additional assessment(s) for plan details. 

## 2022-10-30 NOTE — Progress Notes (Signed)
     Primary Care / Sports Medicine Office Visit  Patient Information:  Patient ID: Manuel Beard, male DOB: 14-May-1946 Age: 77 y.o. MRN: [id number]   Manuel Beard is a pleasant 77 y.o. male presenting with the following:  Chief Complaint  Patient presents with   Muscle cramping    Vitals:   10/30/22 1310  BP: (!) 140/90  Pulse: 74  SpO2: 95%   Vitals:   10/30/22 1310  Weight: 249 lb (112.9 kg)  Height: 5\' 11"  (1.803 m)   Body mass index is 34.73 kg/m.  No results found.   Independent interpretation of notes and tests performed by another provider:   None  Procedures performed:   None  Pertinent History, Exam, Impression, and Recommendations:   Manuel Beard was seen today for muscle cramping.  Cervical spondylosis with myelopathy Assessment & Plan: Chronic with stable symptoms overall well-controlled with gabapentin 100 mg twice daily, additionally taking several over-the-counter supplements for muscle spasms.  We discussed the role of his underlying spine degenerative changes and associated symptoms given response to gabapentin.  Plan as follows: - Continue gabapentin 100 mg twice daily - Trial of OTC Nervive (ALA, B complex, ginger, turmeric), if beneficial he can this up OTC or purchase individual ingredients - Patient has option to return in 3 months for continued gabapentin monitoring, adjustment, and dosing OR he can obtain this through his PCP at their discretion  Orders: -     Gabapentin; Take 1 capsule (100 mg total) by mouth 2 (two) times daily.  Dispense: 180 capsule; Refill: 0  Lumbar spondylosis with myelopathy Assessment & Plan: See additional assessment(s) for plan details.  Orders: -     Gabapentin; Take 1 capsule (100 mg total) by mouth 2 (two) times daily.  Dispense: 180 capsule; Refill: 0    I provided a total time of 37 minutes including both face-to-face and non-face-to-face time on 10/30/2022 inclusive of time utilized for medical chart  review, information gathering, care coordination with staff, and documentation completion.  Orders & Medications Meds ordered this encounter  Medications   gabapentin (NEURONTIN) 100 MG capsule    Sig: Take 1 capsule (100 mg total) by mouth 2 (two) times daily.    Dispense:  180 capsule    Refill:  0    Pt need appointment before next refill   No orders of the defined types were placed in this encounter.    No follow-ups on file.     Jerrol Banana, MD, [facility]   Primary Care Sports Medicine Primary Care and Sports Medicine at [facility]

## 2022-10-30 NOTE — Patient Instructions (Signed)
-   Continue gabapentin twice daily - Continue your current supplements and regular exercise - Can trial Nervive supplement, if beneficial can continue this (available over-the-counter) - Can return in 3 months for gabapentin check/refill, alternatively can touch base with your primary care doctor for gabapentin management - Contact us for any question/concerns
